# Patient Record
Sex: Male | Born: 1970 | Hispanic: No
Health system: Southern US, Community
[De-identification: ages and names within clinical notes are randomized; demographics above are authoritative.]

## PROBLEM LIST (undated history)

## (undated) DIAGNOSIS — R6520 Severe sepsis without septic shock: Secondary | ICD-10-CM

## (undated) DIAGNOSIS — J9621 Acute and chronic respiratory failure with hypoxia: Secondary | ICD-10-CM

## (undated) DIAGNOSIS — G4733 Obstructive sleep apnea (adult) (pediatric): Secondary | ICD-10-CM

## (undated) DIAGNOSIS — N170 Acute kidney failure with tubular necrosis: Secondary | ICD-10-CM

## (undated) DIAGNOSIS — F1011 Alcohol abuse, in remission: Secondary | ICD-10-CM

## (undated) DIAGNOSIS — A419 Sepsis, unspecified organism: Secondary | ICD-10-CM

---

## 2020-07-14 ENCOUNTER — Institutional Professional Consult (permissible substitution)
Admission: RE | Admit: 2020-07-14 | Discharge: 2020-08-20 | Disposition: A | Discharge status: Other Acute Inpatient Hospital | Payer: No Typology Code available for payment source | Source: Ambulatory Visit | Attending: Internal Medicine | Admitting: Internal Medicine

## 2020-07-14 ENCOUNTER — Other Ambulatory Visit (HOSPITAL_COMMUNITY): Payer: No Typology Code available for payment source

## 2020-07-14 DIAGNOSIS — Z930 Tracheostomy status: Secondary | ICD-10-CM

## 2020-07-14 DIAGNOSIS — J811 Chronic pulmonary edema: Secondary | ICD-10-CM

## 2020-07-14 DIAGNOSIS — Z9911 Dependence on respirator [ventilator] status: Secondary | ICD-10-CM

## 2020-07-14 DIAGNOSIS — R509 Fever, unspecified: Secondary | ICD-10-CM

## 2020-07-14 DIAGNOSIS — R238 Other skin changes: Secondary | ICD-10-CM

## 2020-07-14 DIAGNOSIS — G4733 Obstructive sleep apnea (adult) (pediatric): Secondary | ICD-10-CM | POA: Diagnosis present

## 2020-07-14 DIAGNOSIS — A419 Sepsis, unspecified organism: Secondary | ICD-10-CM | POA: Diagnosis present

## 2020-07-14 DIAGNOSIS — R609 Edema, unspecified: Secondary | ICD-10-CM

## 2020-07-14 DIAGNOSIS — R6520 Severe sepsis without septic shock: Secondary | ICD-10-CM | POA: Diagnosis present

## 2020-07-14 DIAGNOSIS — J690 Pneumonitis due to inhalation of food and vomit: Secondary | ICD-10-CM

## 2020-07-14 DIAGNOSIS — J189 Pneumonia, unspecified organism: Secondary | ICD-10-CM

## 2020-07-14 DIAGNOSIS — Z931 Gastrostomy status: Secondary | ICD-10-CM

## 2020-07-14 DIAGNOSIS — N170 Acute kidney failure with tubular necrosis: Secondary | ICD-10-CM | POA: Diagnosis present

## 2020-07-14 DIAGNOSIS — F1011 Alcohol abuse, in remission: Secondary | ICD-10-CM | POA: Diagnosis present

## 2020-07-14 DIAGNOSIS — Z452 Encounter for adjustment and management of vascular access device: Secondary | ICD-10-CM

## 2020-07-14 DIAGNOSIS — J90 Pleural effusion, not elsewhere classified: Secondary | ICD-10-CM

## 2020-07-14 DIAGNOSIS — J9621 Acute and chronic respiratory failure with hypoxia: Secondary | ICD-10-CM | POA: Diagnosis present

## 2020-07-14 DIAGNOSIS — J939 Pneumothorax, unspecified: Secondary | ICD-10-CM

## 2020-07-14 DIAGNOSIS — J9690 Respiratory failure, unspecified, unspecified whether with hypoxia or hypercapnia: Secondary | ICD-10-CM

## 2020-07-14 HISTORY — DX: Sepsis, unspecified organism: A41.9

## 2020-07-14 HISTORY — DX: Alcohol abuse, in remission: F10.11

## 2020-07-14 HISTORY — DX: Obstructive sleep apnea (adult) (pediatric): G47.33

## 2020-07-14 HISTORY — DX: Acute and chronic respiratory failure with hypoxia: J96.21

## 2020-07-14 HISTORY — DX: Severe sepsis without septic shock: R65.20

## 2020-07-14 HISTORY — DX: Acute kidney failure with tubular necrosis: N17.0

## 2020-07-14 LAB — BLOOD GAS, ARTERIAL
Acid-base deficit: 0.3 mmol/L (ref 0.0–2.0)
Bicarbonate: 24.2 mmol/L (ref 20.0–28.0)
FIO2: 40
O2 Saturation: 94.4 %
Patient temperature: 36.7
pCO2 arterial: 41.6 mmHg (ref 32.0–48.0)
pH, Arterial: 7.381 (ref 7.350–7.450)
pO2, Arterial: 74.6 mmHg — ABNORMAL LOW (ref 83.0–108.0)

## 2020-07-14 MED ORDER — IOHEXOL 350 MG/ML SOLN
50.0000 mL | Freq: Once | INTRAVENOUS | Status: AC | PRN
  Administered 2020-07-14: 50 mL

## 2020-07-15 ENCOUNTER — Other Ambulatory Visit (HOSPITAL_COMMUNITY): Payer: No Typology Code available for payment source

## 2020-07-15 ENCOUNTER — Encounter: Payer: Self-pay | Admitting: Internal Medicine

## 2020-07-15 DIAGNOSIS — J9621 Acute and chronic respiratory failure with hypoxia: Secondary | ICD-10-CM | POA: Diagnosis not present

## 2020-07-15 DIAGNOSIS — F1011 Alcohol abuse, in remission: Secondary | ICD-10-CM

## 2020-07-15 DIAGNOSIS — Z930 Tracheostomy status: Secondary | ICD-10-CM

## 2020-07-15 DIAGNOSIS — N170 Acute kidney failure with tubular necrosis: Secondary | ICD-10-CM | POA: Diagnosis present

## 2020-07-15 DIAGNOSIS — R6520 Severe sepsis without septic shock: Secondary | ICD-10-CM

## 2020-07-15 DIAGNOSIS — A419 Sepsis, unspecified organism: Secondary | ICD-10-CM

## 2020-07-15 DIAGNOSIS — G4733 Obstructive sleep apnea (adult) (pediatric): Secondary | ICD-10-CM | POA: Diagnosis not present

## 2020-07-15 DIAGNOSIS — Z9911 Dependence on respirator [ventilator] status: Secondary | ICD-10-CM

## 2020-07-15 LAB — BASIC METABOLIC PANEL
Anion gap: 10 (ref 5–15)
BUN: 19 mg/dL (ref 6–20)
CO2: 23 mmol/L (ref 22–32)
Calcium: 10 mg/dL (ref 8.9–10.3)
Chloride: 101 mmol/L (ref 98–111)
Creatinine, Ser: 0.64 mg/dL (ref 0.61–1.24)
GFR, Estimated: >60 mL/min (ref >60)
Glucose, Bld: 209 mg/dL — ABNORMAL HIGH (ref 70–99)
Potassium: 3.6 mmol/L (ref 3.5–5.1)
Sodium: 134 mmol/L — ABNORMAL LOW (ref 135–145)

## 2020-07-15 LAB — URINALYSIS, ROUTINE W REFLEX MICROSCOPIC
Bacteria, UA: NONE SEEN
Bilirubin Urine: NEGATIVE
Glucose, UA: NEGATIVE mg/dL
Hgb urine dipstick: NEGATIVE
Ketones, ur: NEGATIVE mg/dL
Leukocytes,Ua: NEGATIVE
Nitrite: NEGATIVE
Protein, ur: 30 mg/dL — AB
Specific Gravity, Urine: 1.013 (ref 1.005–1.030)
pH: 5 (ref 5.0–8.0)

## 2020-07-15 LAB — CBC
HCT: 32.3 % — ABNORMAL LOW (ref 39.0–52.0)
Hemoglobin: 10 g/dL — ABNORMAL LOW (ref 13.0–17.0)
MCH: 29.1 pg (ref 26.0–34.0)
MCHC: 31 g/dL (ref 30.0–36.0)
MCV: 93.9 fL (ref 80.0–100.0)
Platelets: 396 10*3/uL (ref 150–400)
RBC: 3.44 MIL/uL — ABNORMAL LOW (ref 4.22–5.81)
RDW: 17.4 % — ABNORMAL HIGH (ref 11.5–15.5)
WBC: 12.4 10*3/uL — ABNORMAL HIGH (ref 4.0–10.5)
nRBC: 0 % (ref 0.0–0.2)

## 2020-07-15 LAB — TRIGLYCERIDES: Triglycerides: 701 mg/dL — ABNORMAL HIGH (ref <150)

## 2020-07-15 NOTE — Consult Note (Signed)
Pulmonary Critical Care Medicine [facility] [place]  PULMONARY SERVICE  Date of Service: 07/15/2020  PULMONARY CRITICAL CARE CONSULT   Alex Rojas  [id number]  DOB: [birth year]   DOA: 07/14/2020  Referring Physician: Carron Curie, MD  HPI: Alex Rojas is a 50 y.o. male seen for follow up of Acute on Chronic Respiratory Failure.  Patient has multiple medical problems including respiratory failure severe sepsis sleep apnea DKA metabolic encephalopathy came into the hospital with sepsis from right groin infection.  The patient was evaluated at the time was significantly toxic appearance ended up having to be taken to the OR had an I&D done postoperatively developed respiratory failure rapidly was intubated and then remained on the ventilator afterwards.  Patient had initially been placed on Levophed as well as propofol for sedation.  Apparently self extubated on 30 March failed therefore had to be reintubated.  Patient was extubated on April 2 but by 4 April had to be reintubated.  Subsequently patient had a tracheostomy and a PEG tube placed.  Now is transferred to our facility for further management  Review of Systems:  ROS performed and is unremarkable other than noted above.  PAST MEDICAL HISTORY Past Medical History:  Diagnosis Date  . Anxiety  . Diabetes mellitus (HCC)  . Knee pain  left  . Low back pain    PAST SURGICAL HISTORY Past Surgical History:  Procedure Laterality Date  . HX CHOLECYSTECTOMY  . HX EYE SURGERY  as an infant   ALLERGIES No Known Allergies  FAMILY HISTORY Family History  Problem Relation Name Age of Onset  . Unknown Father  . Unknown Mother   SOCIAL HISTORY Social History   Tobacco Use  . Smoking status: Current Every Day Smoker  Packs/day: 0.50  Years: 20.00  Pack years: 10.00  . Smokeless tobacco: Never Used  . Tobacco comment: Pt encouraged to contact PCP when ready to quit  Substance Use Topics  .  Alcohol use: Yes  Comment: one glass of wine every other week  . Drug use: Not Currently  Types: Marijuana  Comment: last use 2018    Medications: Reviewed on Rounds  Physical Exam:  Vitals: Temperature is 99.3 pulse 118 respiratory rate 26 blood pressure is 144/79 saturations 94%  Ventilator Settings on assist control FiO2 is 40% tidal volume 500 PEEP 8  . General: Comfortable at this time . Eyes: Grossly normal lids, irises & conjunctiva . ENT: grossly tongue is normal . Neck: no obvious mass . Cardiovascular: S1-S2 normal no gallop or rub . Respiratory: No rhonchi no rales are noted at this time . Abdomen: Soft nontender . Skin: no rash seen on limited exam . Musculoskeletal: not rigid . Psychiatric:unable to assess . Neurologic: no seizure no involuntary movements         Labs on Admission:  Basic Metabolic Panel: Recent Labs  Lab 07/15/20 0526  NA 134*  K 3.6  CL 101  CO2 23  GLUCOSE 209*  BUN 19  CREATININE 0.64  CALCIUM 10.0    Recent Labs  Lab 07/14/20 2230  PHART 7.381  PCO2ART 41.6  PO2ART 74.6*  HCO3 24.2  O2SAT 94.4    Liver Function Tests: No results for input(s): AST, ALT, ALKPHOS, BILITOT, PROT, ALBUMIN in the last 168 hours. No results for input(s): LIPASE, AMYLASE in the last 168 hours. No results for input(s): AMMONIA in the last 168 hours.  CBC: Recent Labs  Lab 07/15/20 0526  WBC 12.4*  HGB  10.0*  HCT 32.3*  MCV 93.9  PLT 396    Cardiac Enzymes: No results for input(s): CKTOTAL, CKMB, CKMBINDEX, TROPONINI in the last 168 hours.  BNP (last 3 results) No results for input(s): BNP in the last 8760 hours.  ProBNP (last 3 results) No results for input(s): PROBNP in the last 8760 hours.   Radiological Exams on Admission: DG ABDOMEN PEG TUBE LOCATION  Result Date: 07/14/2020 CLINICAL DATA:  Status post percutaneous gastrostomy tube placement. EXAM: ABDOMEN - 1 VIEW COMPARISON:  None. FINDINGS: Portable AP supine view of  the abdomen obtained after the installation of 50 cc Omnipaque 350 through indwelling gastrostomy tube. Contrast opacifies the gastrostomy tubing and the stomach. There is no evidence of extravasation or leak. Normal bowel gas pattern. Right upper quadrant surgical clips. IMPRESSION: Gastrostomy tube in the stomach. No evidence of extravasation or leak. Electronically Signed   By: Narda Rutherford M.D.   On: 07/14/2020 23:20   DG Chest Port 1 View  Result Date: 07/15/2020 CLINICAL DATA:  Respiratory failure EXAM: PORTABLE CHEST 1 VIEW COMPARISON:  None. FINDINGS: Tracheostomy catheter tip is 6.7 cm above the carina. Central catheter tip is at the cavoatrial junction. No pneumothorax. There is no edema or airspace opacity. There is mild cardiomegaly with pulmonary vascularity normal. No adenopathy. No bone lesions. IMPRESSION: Tube and catheter positions as described without pneumothorax. Lungs clear. Mild cardiac enlargement. Electronically Signed   By: Bretta Bang III M.D.   On: 07/15/2020 08:46    Assessment/Plan Active Problems:   Acute on chronic respiratory failure with hypoxia (HCC)   Severe sepsis (HCC)   Obstructive sleep apnea   History of alcohol abuse   Acute renal failure due to tubular necrosis (HCC)   1. Acute on chronic respiratory failure hypoxia patient remains on mechanical ventilation at this time.  Respiratory therapy is going to check the mechanics however patient is still on propofol which will need to be weaned off and medications readjusted. 2. Severe sepsis patient has received antibiotics plan is going to be to continue with full support at this time. 3. Obstructive sleep apnea nonissue at this stage 4. History of alcohol abuse this may be the challenge as far as being able to wean the patient off of the propofol.  We need to reassess the medications for sedation. 5. Acute renal failure her plan will be to continue to monitor the patient's labs closely.  Right now  BUN/creatinine appear to be at baseline  I have personally seen and evaluated the patient, evaluated laboratory and imaging results, formulated the assessment and plan and placed orders. The Patient requires high complexity decision making with multiple systems involvement.  Case was discussed on Rounds with the Respiratory Therapy Director and the Respiratory staff Time Spent  Yevonne Pax, MD [facility] Pulmonary Critical Care Medicine Sleep Medicine

## 2020-07-16 DIAGNOSIS — N170 Acute kidney failure with tubular necrosis: Secondary | ICD-10-CM | POA: Diagnosis not present

## 2020-07-16 DIAGNOSIS — F1011 Alcohol abuse, in remission: Secondary | ICD-10-CM | POA: Diagnosis not present

## 2020-07-16 DIAGNOSIS — G4733 Obstructive sleep apnea (adult) (pediatric): Secondary | ICD-10-CM | POA: Diagnosis not present

## 2020-07-16 DIAGNOSIS — J9621 Acute and chronic respiratory failure with hypoxia: Secondary | ICD-10-CM | POA: Diagnosis not present

## 2020-07-16 LAB — HEMOGLOBIN A1C
Hgb A1c MFr Bld: 11 % — ABNORMAL HIGH (ref 4.8–5.6)
Mean Plasma Glucose: 269 mg/dL

## 2020-07-16 LAB — CBC
HCT: 32.7 % — ABNORMAL LOW (ref 39.0–52.0)
Hemoglobin: 10.3 g/dL — ABNORMAL LOW (ref 13.0–17.0)
MCH: 29.9 pg (ref 26.0–34.0)
MCHC: 31.5 g/dL (ref 30.0–36.0)
MCV: 95.1 fL (ref 80.0–100.0)
Platelets: 409 10*3/uL — ABNORMAL HIGH (ref 150–400)
RBC: 3.44 MIL/uL — ABNORMAL LOW (ref 4.22–5.81)
RDW: 17 % — ABNORMAL HIGH (ref 11.5–15.5)
WBC: 10.6 10*3/uL — ABNORMAL HIGH (ref 4.0–10.5)
nRBC: 0 % (ref 0.0–0.2)

## 2020-07-16 LAB — COMPREHENSIVE METABOLIC PANEL
ALT: 32 U/L (ref 0–44)
AST: 46 U/L — ABNORMAL HIGH (ref 15–41)
Albumin: 2.4 g/dL — ABNORMAL LOW (ref 3.5–5.0)
Alkaline Phosphatase: 117 U/L (ref 38–126)
Anion gap: 11 (ref 5–15)
BUN: 26 mg/dL — ABNORMAL HIGH (ref 6–20)
CO2: 27 mmol/L (ref 22–32)
Calcium: 10.5 mg/dL — ABNORMAL HIGH (ref 8.9–10.3)
Chloride: 99 mmol/L (ref 98–111)
Creatinine, Ser: 0.57 mg/dL — ABNORMAL LOW (ref 0.61–1.24)
GFR, Estimated: >60 mL/min (ref >60)
Glucose, Bld: 279 mg/dL — ABNORMAL HIGH (ref 70–99)
Potassium: 3.6 mmol/L (ref 3.5–5.1)
Sodium: 137 mmol/L (ref 135–145)
Total Bilirubin: 0.6 mg/dL (ref 0.3–1.2)
Total Protein: 7.6 g/dL (ref 6.5–8.1)

## 2020-07-16 LAB — URINE CULTURE: Culture: NO GROWTH

## 2020-07-16 LAB — PHOSPHORUS: Phosphorus: 5.3 mg/dL — ABNORMAL HIGH (ref 2.5–4.6)

## 2020-07-16 LAB — MAGNESIUM: Magnesium: 1.9 mg/dL (ref 1.7–2.4)

## 2020-07-16 LAB — TSH: TSH: 5.149 u[IU]/mL — ABNORMAL HIGH (ref 0.350–4.500)

## 2020-07-16 NOTE — Progress Notes (Signed)
Pulmonary Critical Care Medicine [facility] [place]   PULMONARY CRITICAL CARE SERVICE  PROGRESS NOTE     Alex Rojas  [id number]  DOB: Jun 10, 1970   DOA: 07/14/2020  Referring Physician: Carron Curie, MD  HPI: Alex Rojas is a 50 y.o. male seen for follow up of Acute on Chronic Respiratory Failure.  Patient is comfortably without distress has been on pressure support  Medications: Reviewed on Rounds  Physical Exam:  Vitals: Temperature is 98.0 pulse 90 respiratory rate is 17 blood pressure is 128/76 saturations 95%  Ventilator Settings on pressure support FiO2 40% pressure 12/6  . General: Comfortable at this time . Eyes: Grossly normal lids, irises & conjunctiva . ENT: grossly tongue is normal . Neck: no obvious mass . Cardiovascular: S1 S2 normal no gallop . Respiratory: No rhonchi no rales are noted at this time . Abdomen: soft . Skin: no rash seen on limited exam . Musculoskeletal: not rigid . Psychiatric:unable to assess . Neurologic: no seizure no involuntary movements         Lab Data:   Basic Metabolic Panel: Recent Labs  Lab 07/15/20 0526 07/16/20 0609  NA 134* 137  K 3.6 3.6  CL 101 99  CO2 23 27  GLUCOSE 209* 279*  BUN 19 26*  CREATININE 0.64 0.57*  CALCIUM 10.0 10.5*  MG  --  1.9  PHOS  --  5.3*    ABG: Recent Labs  Lab 07/14/20 2230  PHART 7.381  PCO2ART 41.6  PO2ART 74.6*  HCO3 24.2  O2SAT 94.4    Liver Function Tests: Recent Labs  Lab 07/16/20 0609  AST 46*  ALT 32  ALKPHOS 117  BILITOT 0.6  PROT 7.6  ALBUMIN 2.4*   No results for input(s): LIPASE, AMYLASE in the last 168 hours. No results for input(s): AMMONIA in the last 168 hours.  CBC: Recent Labs  Lab 07/15/20 0526 07/16/20 0609  WBC 12.4* 10.6*  HGB 10.0* 10.3*  HCT 32.3* 32.7*  MCV 93.9 95.1  PLT 396 409*    Cardiac Enzymes: No results for input(s): CKTOTAL, CKMB, CKMBINDEX, TROPONINI in the last 168 hours.  BNP  (last 3 results) No results for input(s): BNP in the last 8760 hours.  ProBNP (last 3 results) No results for input(s): PROBNP in the last 8760 hours.  Radiological Exams: DG ABDOMEN PEG TUBE LOCATION  Result Date: 07/14/2020 CLINICAL DATA:  Status post percutaneous gastrostomy tube placement. EXAM: ABDOMEN - 1 VIEW COMPARISON:  None. FINDINGS: Portable AP supine view of the abdomen obtained after the installation of 50 cc Omnipaque 350 through indwelling gastrostomy tube. Contrast opacifies the gastrostomy tubing and the stomach. There is no evidence of extravasation or leak. Normal bowel gas pattern. Right upper quadrant surgical clips. IMPRESSION: Gastrostomy tube in the stomach. No evidence of extravasation or leak. Electronically Signed   By: Narda Rutherford M.D.   On: 07/14/2020 23:20   DG Chest Port 1 View  Result Date: 07/15/2020 CLINICAL DATA:  Respiratory failure EXAM: PORTABLE CHEST 1 VIEW COMPARISON:  None. FINDINGS: Tracheostomy catheter tip is 6.7 cm above the carina. Central catheter tip is at the cavoatrial junction. No pneumothorax. There is no edema or airspace opacity. There is mild cardiomegaly with pulmonary vascularity normal. No adenopathy. No bone lesions. IMPRESSION: Tube and catheter positions as described without pneumothorax. Lungs clear. Mild cardiac enlargement. Electronically Signed   By: Bretta Bang III M.D.   On: 07/15/2020 08:46    Assessment/Plan Active Problems:  Acute on chronic respiratory failure with hypoxia (HCC)   Severe sepsis (HCC)   Obstructive sleep apnea   History of alcohol abuse   Acute renal failure due to tubular necrosis (HCC)   1. Acute on chronic respiratory failure hypoxia plan is going to be to continue with pulmonary we will on pressure support goal of 4 hours. 2. Severe sepsis treated slowly improving 3. Obstructive sleep apnea supportive care patient is on the ventilator 4. History of alcohol abuse no change 5. Acute  renal failure patient is at baseline   I have personally seen and evaluated the patient, evaluated laboratory and imaging results, formulated the assessment and plan and placed orders. The Patient requires high complexity decision making with multiple systems involvement.  Rounds were done with the Respiratory Therapy Director and Staff therapists and discussed with nursing staff also.  Yevonne Pax, MD [facility] Pulmonary Critical Care Medicine Sleep Medicine

## 2020-07-17 DIAGNOSIS — G4733 Obstructive sleep apnea (adult) (pediatric): Secondary | ICD-10-CM | POA: Diagnosis not present

## 2020-07-17 DIAGNOSIS — N170 Acute kidney failure with tubular necrosis: Secondary | ICD-10-CM | POA: Diagnosis not present

## 2020-07-17 DIAGNOSIS — F1011 Alcohol abuse, in remission: Secondary | ICD-10-CM | POA: Diagnosis not present

## 2020-07-17 DIAGNOSIS — J9621 Acute and chronic respiratory failure with hypoxia: Secondary | ICD-10-CM | POA: Diagnosis not present

## 2020-07-17 LAB — MAGNESIUM: Magnesium: 1.9 mg/dL (ref 1.7–2.4)

## 2020-07-17 LAB — BASIC METABOLIC PANEL
Anion gap: 11 (ref 5–15)
BUN: 30 mg/dL — ABNORMAL HIGH (ref 6–20)
CO2: 28 mmol/L (ref 22–32)
Calcium: 10.5 mg/dL — ABNORMAL HIGH (ref 8.9–10.3)
Chloride: 97 mmol/L — ABNORMAL LOW (ref 98–111)
Creatinine, Ser: 0.63 mg/dL (ref 0.61–1.24)
GFR, Estimated: >60 mL/min (ref >60)
Glucose, Bld: 289 mg/dL — ABNORMAL HIGH (ref 70–99)
Potassium: 3.6 mmol/L (ref 3.5–5.1)
Sodium: 136 mmol/L (ref 135–145)

## 2020-07-17 NOTE — Progress Notes (Signed)
Pulmonary Critical Care Medicine [facility] [place]   PULMONARY CRITICAL CARE SERVICE  PROGRESS NOTE     Alex Rojas  [id number]  DOB: 1970-12-27   DOA: 07/14/2020  Referring Physician: Carron Curie, MD  HPI: Alex Rojas is a 50 y.o. male seen for follow up of Acute on Chronic Respiratory Failure.  Patient right now is on pressure support has been on 35% FiO2 with pressure of 10/5  Medications: Reviewed on Rounds  Physical Exam:  Vitals: Temperature 100.0 pulse 106 respiratory rate is 17 blood pressure is 127/74 saturations 93%  Ventilator Settings on pressure support FiO2 35% pressure 10/5  . General: Comfortable at this time . Eyes: Grossly normal lids, irises & conjunctiva . ENT: grossly tongue is normal . Neck: no obvious mass . Cardiovascular: S1 S2 normal no gallop . Respiratory: No rhonchi no rales noted at this time . Abdomen: soft . Skin: no rash seen on limited exam . Musculoskeletal: not rigid . Psychiatric:unable to assess . Neurologic: no seizure no involuntary movements         Lab Data:   Basic Metabolic Panel: Recent Labs  Lab 07/15/20 0526 07/16/20 0609 07/17/20 0426  NA 134* 137 136  K 3.6 3.6 3.6  CL 101 99 97*  CO2 23 27 28   GLUCOSE 209* 279* 289*  BUN 19 26* 30*  CREATININE 0.64 0.57* 0.63  CALCIUM 10.0 10.5* 10.5*  MG  --  1.9 1.9  PHOS  --  5.3*  --     ABG: Recent Labs  Lab 07/14/20 2230  PHART 7.381  PCO2ART 41.6  PO2ART 74.6*  HCO3 24.2  O2SAT 94.4    Liver Function Tests: Recent Labs  Lab 07/16/20 0609  AST 46*  ALT 32  ALKPHOS 117  BILITOT 0.6  PROT 7.6  ALBUMIN 2.4*   No results for input(s): LIPASE, AMYLASE in the last 168 hours. No results for input(s): AMMONIA in the last 168 hours.  CBC: Recent Labs  Lab 07/15/20 0526 07/16/20 0609  WBC 12.4* 10.6*  HGB 10.0* 10.3*  HCT 32.3* 32.7*  MCV 93.9 95.1  PLT 396 409*    Cardiac Enzymes: No results for input(s):  CKTOTAL, CKMB, CKMBINDEX, TROPONINI in the last 168 hours.  BNP (last 3 results) No results for input(s): BNP in the last 8760 hours.  ProBNP (last 3 results) No results for input(s): PROBNP in the last 8760 hours.  Radiological Exams: No results found.  Assessment/Plan Active Problems:   Acute on chronic respiratory failure with hypoxia (HCC)   Severe sepsis (HCC)   Obstructive sleep apnea   History of alcohol abuse   Acute renal failure due to tubular necrosis (HCC)   1. Acute on chronic respiratory failure with hypoxia plan is to continue with weaning on pressure with a goal of 8 hours 2. Severe sepsis treated and resolution 3. Obstructive sleep apnea no change 4. Alcohol abuse by history continue supportive care 5. Acute renal failure no change continue to follow   I have personally seen and evaluated the patient, evaluated laboratory and imaging results, formulated the assessment and plan and placed orders. The Patient requires high complexity decision making with multiple systems involvement.  Rounds were done with the Respiratory Therapy Director and Staff therapists and discussed with nursing staff also.  07/18/20, MD [facility] Pulmonary Critical Care Medicine Sleep Medicine

## 2020-07-18 DIAGNOSIS — F1011 Alcohol abuse, in remission: Secondary | ICD-10-CM | POA: Diagnosis not present

## 2020-07-18 DIAGNOSIS — G4733 Obstructive sleep apnea (adult) (pediatric): Secondary | ICD-10-CM | POA: Diagnosis not present

## 2020-07-18 DIAGNOSIS — N170 Acute kidney failure with tubular necrosis: Secondary | ICD-10-CM | POA: Diagnosis not present

## 2020-07-18 DIAGNOSIS — J9621 Acute and chronic respiratory failure with hypoxia: Secondary | ICD-10-CM | POA: Diagnosis not present

## 2020-07-18 LAB — CULTURE, RESPIRATORY W GRAM STAIN

## 2020-07-18 LAB — BASIC METABOLIC PANEL WITH GFR
Anion gap: 11 (ref 5–15)
BUN: 44 mg/dL — ABNORMAL HIGH (ref 6–20)
CO2: 28 mmol/L (ref 22–32)
Calcium: 11.3 mg/dL — ABNORMAL HIGH (ref 8.9–10.3)
Chloride: 98 mmol/L (ref 98–111)
Creatinine, Ser: 0.73 mg/dL (ref 0.61–1.24)
GFR, Estimated: >60 mL/min
Glucose, Bld: 262 mg/dL — ABNORMAL HIGH (ref 70–99)
Potassium: 3.4 mmol/L — ABNORMAL LOW (ref 3.5–5.1)
Sodium: 137 mmol/L (ref 135–145)

## 2020-07-18 LAB — TRIGLYCERIDES: Triglycerides: 670 mg/dL — ABNORMAL HIGH

## 2020-07-18 LAB — CBC
HCT: 32.1 % — ABNORMAL LOW (ref 39.0–52.0)
Hemoglobin: 10.2 g/dL — ABNORMAL LOW (ref 13.0–17.0)
MCH: 29.2 pg (ref 26.0–34.0)
MCHC: 31.8 g/dL (ref 30.0–36.0)
MCV: 92 fL (ref 80.0–100.0)
Platelets: 448 10*3/uL — ABNORMAL HIGH (ref 150–400)
RBC: 3.49 MIL/uL — ABNORMAL LOW (ref 4.22–5.81)
RDW: 16.5 % — ABNORMAL HIGH (ref 11.5–15.5)
WBC: 11.1 10*3/uL — ABNORMAL HIGH (ref 4.0–10.5)
nRBC: 0.3 % — ABNORMAL HIGH (ref 0.0–0.2)

## 2020-07-18 LAB — MAGNESIUM: Magnesium: 2 mg/dL (ref 1.7–2.4)

## 2020-07-18 LAB — PHOSPHORUS: Phosphorus: 4.2 mg/dL (ref 2.5–4.6)

## 2020-07-18 NOTE — Progress Notes (Signed)
Pulmonary Critical Care Medicine [facility] [place]   PULMONARY CRITICAL CARE SERVICE  PROGRESS NOTE     Alex Rojas  [id number]  DOB: [birth year]   DOA: 07/14/2020  Referring Physician: Carron Curie, MD  HPI: Alex Rojas is a 50 y.o. male seen for follow up of Acute on Chronic Respiratory Failure.  Patient currently is on the ventilator on assist control mode seems to be tolerating his current settings fairly well  Medications: Reviewed on Rounds  Physical Exam:  Vitals: Temperature is 98.8 pulse 103 respiratory rate 30 blood pressure is 116/64 saturations 94%  Ventilator Settings on assist control tidal volume 500 PEEP 5  . General: Comfortable at this time . Eyes: Grossly normal lids, irises & conjunctiva . ENT: grossly tongue is normal . Neck: no obvious mass . Cardiovascular: S1 S2 normal no gallop . Respiratory: Scattered rhonchi expansion is equal . Abdomen: soft . Skin: no rash seen on limited exam . Musculoskeletal: not rigid . Psychiatric:unable to assess . Neurologic: no seizure no involuntary movements         Lab Data:   Basic Metabolic Panel: Recent Labs  Lab 07/15/20 0526 07/16/20 0609 07/17/20 0426 07/18/20 0430  NA 134* 137 136 137  K 3.6 3.6 3.6 3.4*  CL 101 99 97* 98  CO2 23 27 28 28   GLUCOSE 209* 279* 289* 262*  BUN 19 26* 30* 44*  CREATININE 0.64 0.57* 0.63 0.73  CALCIUM 10.0 10.5* 10.5* 11.3*  MG  --  1.9 1.9 2.0  PHOS  --  5.3*  --  4.2    ABG: Recent Labs  Lab 07/14/20 2230  PHART 7.381  PCO2ART 41.6  PO2ART 74.6*  HCO3 24.2  O2SAT 94.4    Liver Function Tests: Recent Labs  Lab 07/16/20 0609  AST 46*  ALT 32  ALKPHOS 117  BILITOT 0.6  PROT 7.6  ALBUMIN 2.4*   No results for input(s): LIPASE, AMYLASE in the last 168 hours. No results for input(s): AMMONIA in the last 168 hours.  CBC: Recent Labs  Lab 07/15/20 0526 07/16/20 0609 07/18/20 0430  WBC 12.4* 10.6* 11.1*  HGB  10.0* 10.3* 10.2*  HCT 32.3* 32.7* 32.1*  MCV 93.9 95.1 92.0  PLT 396 409* 448*    Cardiac Enzymes: No results for input(s): CKTOTAL, CKMB, CKMBINDEX, TROPONINI in the last 168 hours.  BNP (last 3 results) No results for input(s): BNP in the last 8760 hours.  ProBNP (last 3 results) No results for input(s): PROBNP in the last 8760 hours.  Radiological Exams: No results found.  Assessment/Plan Active Problems:   Acute on chronic respiratory failure with hypoxia (HCC)   Severe sepsis (HCC)   Obstructive sleep apnea   History of alcohol abuse   Acute renal failure due to tubular necrosis (HCC)   1. Acute on chronic respiratory failure hypoxia plan is going to be to continue with weaning on pressure support as the patient did yesterday. 2. Severe sepsis treated resolved 3. Obstructive sleep apnea nonissue 4. History of alcohol abuse and no signs of active withdrawal medications need to be adjusted 5. Acute renal failure supportive care monitor lab   I have personally seen and evaluated the patient, evaluated laboratory and imaging results, formulated the assessment and plan and placed orders. The Patient requires high complexity decision making with multiple systems involvement.  Rounds were done with the Respiratory Therapy Director and Staff therapists and discussed with nursing staff also.  07/20/20, MD  [facility] Pulmonary Critical Care Medicine Sleep Medicine

## 2020-07-19 LAB — POTASSIUM: Potassium: 4 mmol/L (ref 3.5–5.1)

## 2020-07-20 ENCOUNTER — Other Ambulatory Visit (HOSPITAL_COMMUNITY): Payer: No Typology Code available for payment source

## 2020-07-20 LAB — BASIC METABOLIC PANEL
Anion gap: 9 (ref 5–15)
BUN: 47 mg/dL — ABNORMAL HIGH (ref 6–20)
CO2: 29 mmol/L (ref 22–32)
Calcium: 10.5 mg/dL — ABNORMAL HIGH (ref 8.9–10.3)
Chloride: 98 mmol/L (ref 98–111)
Creatinine, Ser: 0.91 mg/dL (ref 0.61–1.24)
GFR, Estimated: >60 mL/min (ref >60)
Glucose, Bld: 276 mg/dL — ABNORMAL HIGH (ref 70–99)
Potassium: 4.4 mmol/L (ref 3.5–5.1)
Sodium: 136 mmol/L (ref 135–145)

## 2020-07-20 LAB — CBC
HCT: 32.9 % — ABNORMAL LOW (ref 39.0–52.0)
Hemoglobin: 10.3 g/dL — ABNORMAL LOW (ref 13.0–17.0)
MCH: 29 pg (ref 26.0–34.0)
MCHC: 31.3 g/dL (ref 30.0–36.0)
MCV: 92.7 fL (ref 80.0–100.0)
Platelets: 464 10*3/uL — ABNORMAL HIGH (ref 150–400)
RBC: 3.55 MIL/uL — ABNORMAL LOW (ref 4.22–5.81)
RDW: 16.7 % — ABNORMAL HIGH (ref 11.5–15.5)
WBC: 14.5 10*3/uL — ABNORMAL HIGH (ref 4.0–10.5)
nRBC: 0.1 % (ref 0.0–0.2)

## 2020-07-20 LAB — PHOSPHORUS: Phosphorus: 4.4 mg/dL (ref 2.5–4.6)

## 2020-07-20 LAB — MAGNESIUM: Magnesium: 2.2 mg/dL (ref 1.7–2.4)

## 2020-07-20 LAB — URINALYSIS, ROUTINE W REFLEX MICROSCOPIC
Bilirubin Urine: NEGATIVE
Glucose, UA: NEGATIVE mg/dL
Hgb urine dipstick: NEGATIVE
Ketones, ur: NEGATIVE mg/dL
Leukocytes,Ua: NEGATIVE
Nitrite: NEGATIVE
Protein, ur: NEGATIVE mg/dL
Specific Gravity, Urine: 1.01 (ref 1.005–1.030)
pH: 6 (ref 5.0–8.0)

## 2020-07-21 DIAGNOSIS — G4733 Obstructive sleep apnea (adult) (pediatric): Secondary | ICD-10-CM | POA: Diagnosis not present

## 2020-07-21 DIAGNOSIS — J9621 Acute and chronic respiratory failure with hypoxia: Secondary | ICD-10-CM | POA: Diagnosis not present

## 2020-07-21 DIAGNOSIS — F1011 Alcohol abuse, in remission: Secondary | ICD-10-CM | POA: Diagnosis not present

## 2020-07-21 DIAGNOSIS — N170 Acute kidney failure with tubular necrosis: Secondary | ICD-10-CM | POA: Diagnosis not present

## 2020-07-21 LAB — BASIC METABOLIC PANEL
Anion gap: 9 (ref 5–15)
BUN: 47 mg/dL — ABNORMAL HIGH (ref 6–20)
CO2: 30 mmol/L (ref 22–32)
Calcium: 10.1 mg/dL (ref 8.9–10.3)
Chloride: 96 mmol/L — ABNORMAL LOW (ref 98–111)
Creatinine, Ser: 0.85 mg/dL (ref 0.61–1.24)
GFR, Estimated: >60 mL/min (ref >60)
Glucose, Bld: 263 mg/dL — ABNORMAL HIGH (ref 70–99)
Potassium: 4.5 mmol/L (ref 3.5–5.1)
Sodium: 135 mmol/L (ref 135–145)

## 2020-07-21 LAB — CBC
HCT: 31.5 % — ABNORMAL LOW (ref 39.0–52.0)
Hemoglobin: 9.7 g/dL — ABNORMAL LOW (ref 13.0–17.0)
MCH: 28.8 pg (ref 26.0–34.0)
MCHC: 30.8 g/dL (ref 30.0–36.0)
MCV: 93.5 fL (ref 80.0–100.0)
Platelets: 456 10*3/uL — ABNORMAL HIGH (ref 150–400)
RBC: 3.37 MIL/uL — ABNORMAL LOW (ref 4.22–5.81)
RDW: 16.4 % — ABNORMAL HIGH (ref 11.5–15.5)
WBC: 14.5 10*3/uL — ABNORMAL HIGH (ref 4.0–10.5)
nRBC: 0.1 % (ref 0.0–0.2)

## 2020-07-21 LAB — TRIGLYCERIDES: Triglycerides: 613 mg/dL — ABNORMAL HIGH (ref <150)

## 2020-07-21 LAB — URINE CULTURE: Culture: NO GROWTH

## 2020-07-21 LAB — AMMONIA: Ammonia: 47 umol/L — ABNORMAL HIGH (ref 9–35)

## 2020-07-21 LAB — PHOSPHORUS: Phosphorus: 4.4 mg/dL (ref 2.5–4.6)

## 2020-07-21 LAB — MAGNESIUM: Magnesium: 2.2 mg/dL (ref 1.7–2.4)

## 2020-07-21 NOTE — Progress Notes (Signed)
Pulmonary Critical Care Medicine [facility] [place]   PULMONARY CRITICAL CARE SERVICE  PROGRESS NOTE     Alex Rojas  [id number]  DOB: Nov 11, 1970   DOA: 07/14/2020  Referring Physician: Carron Curie, MD  HPI: Alex Rojas is a 50 y.o. male seen for follow up of Acute on Chronic Respiratory Failure.  Patient is afebrile comfortable right now without distress at this time has been noted to have some fevers over the weekend but appears to have defervesced  Medications: Reviewed on Rounds  Physical Exam:  Vitals: Temperature is 98.9 pulse 101 respiratory 21 blood pressure is 104/60 saturations 94%  Ventilator Settings pressure support FiO2 50% pressure 12/5  . General: Comfortable at this time . Eyes: Grossly normal lids, irises & conjunctiva . ENT: grossly tongue is normal . Neck: no obvious mass . Cardiovascular: S1 S2 normal no gallop . Respiratory: No rhonchi no rales are noted at this time . Abdomen: soft . Skin: no rash seen on limited exam . Musculoskeletal: not rigid . Psychiatric:unable to assess . Neurologic: no seizure no involuntary movements         Lab Data:   Basic Metabolic Panel: Recent Labs  Lab 07/16/20 0609 07/17/20 0426 07/18/20 0430 07/19/20 0249 07/20/20 0419 07/21/20 0630  NA 137 136 137  --  136 135  K 3.6 3.6 3.4* 4.0 4.4 4.5  CL 99 97* 98  --  98 96*  CO2 27 28 28   --  29 30  GLUCOSE 279* 289* 262*  --  276* 263*  BUN 26* 30* 44*  --  47* 47*  CREATININE 0.57* 0.63 0.73  --  0.91 0.85  CALCIUM 10.5* 10.5* 11.3*  --  10.5* 10.1  MG 1.9 1.9 2.0  --  2.2 2.2  PHOS 5.3*  --  4.2  --  4.4 4.4    ABG: Recent Labs  Lab 07/14/20 2230  PHART 7.381  PCO2ART 41.6  PO2ART 74.6*  HCO3 24.2  O2SAT 94.4    Liver Function Tests: Recent Labs  Lab 07/16/20 0609  AST 46*  ALT 32  ALKPHOS 117  BILITOT 0.6  PROT 7.6  ALBUMIN 2.4*   No results for input(s): LIPASE, AMYLASE in the last 168  hours. Recent Labs  Lab 07/21/20 0630  AMMONIA 47*    CBC: Recent Labs  Lab 07/15/20 0526 07/16/20 0609 07/18/20 0430 07/20/20 0419 07/21/20 0630  WBC 12.4* 10.6* 11.1* 14.5* 14.5*  HGB 10.0* 10.3* 10.2* 10.3* 9.7*  HCT 32.3* 32.7* 32.1* 32.9* 31.5*  MCV 93.9 95.1 92.0 92.7 93.5  PLT 396 409* 448* 464* 456*    Cardiac Enzymes: No results for input(s): CKTOTAL, CKMB, CKMBINDEX, TROPONINI in the last 168 hours.  BNP (last 3 results) No results for input(s): BNP in the last 8760 hours.  ProBNP (last 3 results) No results for input(s): PROBNP in the last 8760 hours.  Radiological Exams: DG Chest Port 1 View  Result Date: 07/20/2020 CLINICAL DATA:  Respirator dependent.  Assess for pneumothorax. EXAM: PORTABLE CHEST 1 VIEW COMPARISON:  July 15, 2020 FINDINGS: The heart size and mediastinal contours are stable. Tracheostomy tube is unchanged. Right central venous line is unchanged. The heart size is enlarged unchanged. Prominence the left paratracheal region is identified possibly due to tortuous aorta but underlying mass is not excluded. This is unchanged. Patchy consolidation of medial left lung base is identified. The right lung is clear. The visualized skeletal structures are stable. IMPRESSION: 1. Prominence the left  paratracheal region is identified possibly due to tortuous aorta but underlying mass is not excluded. 2. Patchy consolidation of medial left lung base. Electronically Signed   By: Sherian Rein M.D.   On: 07/20/2020 12:01    Assessment/Plan Active Problems:   Acute on chronic respiratory failure with hypoxia (HCC)   Severe sepsis (HCC)   Obstructive sleep apnea   History of alcohol abuse   Acute renal failure due to tubular necrosis (HCC)   1. Acute on chronic respiratory failure with hypoxia plan is going to be to continue with the wean on pressure support for 16 hours since the temperatures have come down we will continue to reassess 2. Severe sepsis  treated in resolution 3. Obstructive sleep apnea no change 4. Alcohol abuse by history supportive care 5. Acute renal failure no change   I have personally seen and evaluated the patient, evaluated laboratory and imaging results, formulated the assessment and plan and placed orders. The Patient requires high complexity decision making with multiple systems involvement.  Rounds were done with the Respiratory Therapy Director and Staff therapists and discussed with nursing staff also.  Yevonne Pax, MD [facility] Pulmonary Critical Care Medicine Sleep Medicine

## 2020-07-22 DIAGNOSIS — J9621 Acute and chronic respiratory failure with hypoxia: Secondary | ICD-10-CM | POA: Diagnosis not present

## 2020-07-22 DIAGNOSIS — N170 Acute kidney failure with tubular necrosis: Secondary | ICD-10-CM | POA: Diagnosis not present

## 2020-07-22 DIAGNOSIS — G4733 Obstructive sleep apnea (adult) (pediatric): Secondary | ICD-10-CM | POA: Diagnosis not present

## 2020-07-22 DIAGNOSIS — F1011 Alcohol abuse, in remission: Secondary | ICD-10-CM | POA: Diagnosis not present

## 2020-07-22 LAB — CULTURE, RESPIRATORY W GRAM STAIN: Culture: NORMAL

## 2020-07-22 LAB — CBC
HCT: 32.1 % — ABNORMAL LOW (ref 39.0–52.0)
Hemoglobin: 9.7 g/dL — ABNORMAL LOW (ref 13.0–17.0)
MCH: 28.6 pg (ref 26.0–34.0)
MCHC: 30.2 g/dL (ref 30.0–36.0)
MCV: 94.7 fL (ref 80.0–100.0)
Platelets: 405 10*3/uL — ABNORMAL HIGH (ref 150–400)
RBC: 3.39 MIL/uL — ABNORMAL LOW (ref 4.22–5.81)
RDW: 16.8 % — ABNORMAL HIGH (ref 11.5–15.5)
WBC: 12.6 10*3/uL — ABNORMAL HIGH (ref 4.0–10.5)
nRBC: 0.2 % (ref 0.0–0.2)

## 2020-07-22 LAB — VANCOMYCIN, TROUGH: Vancomycin Tr: 32 ug/mL (ref 15–20)

## 2020-07-22 LAB — BASIC METABOLIC PANEL
Anion gap: 8 (ref 5–15)
BUN: 47 mg/dL — ABNORMAL HIGH (ref 6–20)
CO2: 30 mmol/L (ref 22–32)
Calcium: 9.7 mg/dL (ref 8.9–10.3)
Chloride: 99 mmol/L (ref 98–111)
Creatinine, Ser: 1.01 mg/dL (ref 0.61–1.24)
GFR, Estimated: >60 mL/min (ref >60)
Glucose, Bld: 248 mg/dL — ABNORMAL HIGH (ref 70–99)
Potassium: 4.8 mmol/L (ref 3.5–5.1)
Sodium: 137 mmol/L (ref 135–145)

## 2020-07-22 LAB — LACTIC ACID, PLASMA: Lactic Acid, Venous: 1.1 mmol/L (ref 0.5–1.9)

## 2020-07-22 NOTE — Progress Notes (Signed)
Pulmonary Critical Care Medicine [facility] [place]   PULMONARY CRITICAL CARE SERVICE  PROGRESS NOTE     Arlington Sigmund  YBO:[id number]  DOB: May 20, 1970   DOA: 07/14/2020  Referring Physician: Carron Curie, MD  HPI: Alex Rojas is a 50 y.o. male seen for follow up of Acute on Chronic Respiratory Failure.  Patient right now is on assist control mode supposed to T collar wean today for 2 hours  Medications: Reviewed on Rounds  Physical Exam:  Vitals: Temperature is 101 pulse 75 respiratory rate 16 blood pressure is 101/59 saturations 91%  Ventilator Settings on assist control FiO2 50% tidal volume 500 with a PEEP of 5  . General: Comfortable at this time . Eyes: Grossly normal lids, irises & conjunctiva . ENT: grossly tongue is normal . Neck: no obvious mass . Cardiovascular: S1 S2 normal no gallop . Respiratory: No rhonchi no rales noted at this time. . Abdomen: soft . Skin: no rash seen on limited exam . Musculoskeletal: not rigid . Psychiatric:unable to assess . Neurologic: no seizure no involuntary movements         Lab Data:   Basic Metabolic Panel: Recent Labs  Lab 07/16/20 0609 07/17/20 0426 07/18/20 0430 07/19/20 0249 07/20/20 0419 07/21/20 0630 07/22/20 0456  NA 137 136 137  --  136 135 137  K 3.6 3.6 3.4* 4.0 4.4 4.5 4.8  CL 99 97* 98  --  98 96* 99  CO2 27 28 28   --  29 30 30   GLUCOSE 279* 289* 262*  --  276* 263* 248*  BUN 26* 30* 44*  --  47* 47* 47*  CREATININE 0.57* 0.63 0.73  --  0.91 0.85 1.01  CALCIUM 10.5* 10.5* 11.3*  --  10.5* 10.1 9.7  MG 1.9 1.9 2.0  --  2.2 2.2  --   PHOS 5.3*  --  4.2  --  4.4 4.4  --     ABG: No results for input(s): PHART, PCO2ART, PO2ART, HCO3, O2SAT in the last 168 hours.  Liver Function Tests: Recent Labs  Lab 07/16/20 0609  AST 46*  ALT 32  ALKPHOS 117  BILITOT 0.6  PROT 7.6  ALBUMIN 2.4*   No results for input(s): LIPASE, AMYLASE in the last 168 hours. Recent Labs   Lab 07/21/20 0630  AMMONIA 47*    CBC: Recent Labs  Lab 07/16/20 0609 07/18/20 0430 07/20/20 0419 07/21/20 0630 07/22/20 0456  WBC 10.6* 11.1* 14.5* 14.5* 12.6*  HGB 10.3* 10.2* 10.3* 9.7* 9.7*  HCT 32.7* 32.1* 32.9* 31.5* 32.1*  MCV 95.1 92.0 92.7 93.5 94.7  PLT 409* 448* 464* 456* 405*    Cardiac Enzymes: No results for input(s): CKTOTAL, CKMB, CKMBINDEX, TROPONINI in the last 168 hours.  BNP (last 3 results) No results for input(s): BNP in the last 8760 hours.  ProBNP (last 3 results) No results for input(s): PROBNP in the last 8760 hours.  Radiological Exams: No results found.  Assessment/Plan Active Problems:   Acute on chronic respiratory failure with hypoxia (HCC)   Severe sepsis (HCC)   Obstructive sleep apnea   History of alcohol abuse   Acute renal failure due to tubular necrosis (HCC)   1. Acute on chronic respiratory failure hypoxia weaning on T collar for 2 hours today. 2. Severe sepsis treated slow improvement 3. Obstructive sleep apnea nonissue at this time. 4. Alcohol abuse no active withdrawal 5. Acute renal failure following lab   I have personally seen and evaluated  the patient, evaluated laboratory and imaging results, formulated the assessment and plan and placed orders. The Patient requires high complexity decision making with multiple systems involvement.  Rounds were done with the Respiratory Therapy Director and Staff therapists and discussed with nursing staff also.  Allyne Gee, MD [facility] Pulmonary Critical Care Medicine Sleep Medicine

## 2020-07-23 DIAGNOSIS — J9621 Acute and chronic respiratory failure with hypoxia: Secondary | ICD-10-CM | POA: Diagnosis not present

## 2020-07-23 DIAGNOSIS — F1011 Alcohol abuse, in remission: Secondary | ICD-10-CM | POA: Diagnosis not present

## 2020-07-23 DIAGNOSIS — N170 Acute kidney failure with tubular necrosis: Secondary | ICD-10-CM | POA: Diagnosis not present

## 2020-07-23 DIAGNOSIS — G4733 Obstructive sleep apnea (adult) (pediatric): Secondary | ICD-10-CM | POA: Diagnosis not present

## 2020-07-23 LAB — BLOOD GAS, ARTERIAL
Acid-Base Excess: 3 mmol/L — ABNORMAL HIGH (ref 0.0–2.0)
Acid-Base Excess: 2.7 mmol/L — ABNORMAL HIGH (ref 0.0–2.0)
Bicarbonate: 27.6 mmol/L (ref 20.0–28.0)
Bicarbonate: 27.3 mmol/L (ref 20.0–28.0)
FIO2: 50
FIO2: 60
O2 Saturation: 86.5 %
O2 Saturation: 89.5 %
Patient temperature: 38.1
Patient temperature: 37.4
pCO2 arterial: 49.2 mmHg — ABNORMAL HIGH (ref 32.0–48.0)
pCO2 arterial: 47.4 mmHg (ref 32.0–48.0)
pH, Arterial: 7.374 (ref 7.350–7.450)
pH, Arterial: 7.381 (ref 7.350–7.450)
pO2, Arterial: 59.4 mmHg — ABNORMAL LOW (ref 83.0–108.0)
pO2, Arterial: 62.2 mmHg — ABNORMAL LOW (ref 83.0–108.0)

## 2020-07-23 NOTE — Progress Notes (Signed)
Pulmonary Critical Care Medicine [facility] [place]   PULMONARY CRITICAL CARE SERVICE  PROGRESS NOTE     Alex Rojas  [id number]  DOB: June 30, 1970   DOA: 07/14/2020  Referring Physician: Carron Curie, MD  HPI: Alex Rojas is a 49 y.o. male seen for follow up of Acute on Chronic Respiratory Failure.  Patient currently is having a temperature temperature was up to 101.0 remains on T collar 50% FiO2  Medications: Reviewed on Rounds  Physical Exam:  Vitals: Temperature 101.0 pulse 88 respiratory 18 blood pressure is 102/59 saturations 95%  Ventilator Settings off the ventilator on T collar at this time on 50% FiO2  . General: Comfortable at this time . Eyes: Grossly normal lids, irises & conjunctiva . ENT: grossly tongue is normal . Neck: no obvious mass . Cardiovascular: S1 S2 normal no gallop . Respiratory: No rhonchi or rales are noted at this time . Abdomen: soft . Skin: no rash seen on limited exam . Musculoskeletal: not rigid . Psychiatric:unable to assess . Neurologic: no seizure no involuntary movements         Lab Data:   Basic Metabolic Panel: Recent Labs  Lab 07/17/20 0426 07/18/20 0430 07/19/20 0249 07/20/20 0419 07/21/20 0630 07/22/20 0456  NA 136 137  --  136 135 137  K 3.6 3.4* 4.0 4.4 4.5 4.8  CL 97* 98  --  98 96* 99  CO2 28 28  --  29 30 30   GLUCOSE 289* 262*  --  276* 263* 248*  BUN 30* 44*  --  47* 47* 47*  CREATININE 0.63 0.73  --  0.91 0.85 1.01  CALCIUM 10.5* 11.3*  --  10.5* 10.1 9.7  MG 1.9 2.0  --  2.2 2.2  --   PHOS  --  4.2  --  4.4 4.4  --     ABG: No results for input(s): PHART, PCO2ART, PO2ART, HCO3, O2SAT in the last 168 hours.  Liver Function Tests: No results for input(s): AST, ALT, ALKPHOS, BILITOT, PROT, ALBUMIN in the last 168 hours. No results for input(s): LIPASE, AMYLASE in the last 168 hours. Recent Labs  Lab 07/21/20 0630  AMMONIA 47*    CBC: Recent Labs  Lab  07/18/20 0430 07/20/20 0419 07/21/20 0630 07/22/20 0456  WBC 11.1* 14.5* 14.5* 12.6*  HGB 10.2* 10.3* 9.7* 9.7*  HCT 32.1* 32.9* 31.5* 32.1*  MCV 92.0 92.7 93.5 94.7  PLT 448* 464* 456* 405*    Cardiac Enzymes: No results for input(s): CKTOTAL, CKMB, CKMBINDEX, TROPONINI in the last 168 hours.  BNP (last 3 results) No results for input(s): BNP in the last 8760 hours.  ProBNP (last 3 results) No results for input(s): PROBNP in the last 8760 hours.  Radiological Exams: No results found.  Assessment/Plan Active Problems:   Acute on chronic respiratory failure with hypoxia (HCC)   Severe sepsis (HCC)   Obstructive sleep apnea   History of alcohol abuse   Acute renal failure due to tubular necrosis (HCC)   1. Acute on chronic respiratory failure hypoxia plan is going to be to continue with the T-piece because of the temperature we may need to slow down the progression of the weaning. 2. Severe sepsis treated in resolution 3. Obstructive sleep apnea no change 4. History of alcohol abuse patient is at baseline 5. Acute renal failure tubular necrosis we will continue to monitor.   I have personally seen and evaluated the patient, evaluated laboratory and imaging results, formulated the  assessment and plan and placed orders. The Patient requires high complexity decision making with multiple systems involvement.  Rounds were done with the Respiratory Therapy Director and Staff therapists and discussed with nursing staff also.  Allyne Gee, MD [facility] Pulmonary Critical Care Medicine Sleep Medicine

## 2020-07-24 ENCOUNTER — Other Ambulatory Visit (HOSPITAL_COMMUNITY): Payer: No Typology Code available for payment source

## 2020-07-24 DIAGNOSIS — G4733 Obstructive sleep apnea (adult) (pediatric): Secondary | ICD-10-CM | POA: Diagnosis not present

## 2020-07-24 DIAGNOSIS — J9621 Acute and chronic respiratory failure with hypoxia: Secondary | ICD-10-CM | POA: Diagnosis not present

## 2020-07-24 DIAGNOSIS — N170 Acute kidney failure with tubular necrosis: Secondary | ICD-10-CM | POA: Diagnosis not present

## 2020-07-24 DIAGNOSIS — F1011 Alcohol abuse, in remission: Secondary | ICD-10-CM | POA: Diagnosis not present

## 2020-07-24 LAB — MAGNESIUM: Magnesium: 2.1 mg/dL (ref 1.7–2.4)

## 2020-07-24 LAB — HEPATIC FUNCTION PANEL
ALT: 21 U/L (ref 0–44)
AST: 21 U/L (ref 15–41)
Albumin: 2.3 g/dL — ABNORMAL LOW (ref 3.5–5.0)
Alkaline Phosphatase: 71 U/L (ref 38–126)
Bilirubin, Direct: <0.1 mg/dL (ref 0.0–0.2)
Total Bilirubin: 0.4 mg/dL (ref 0.3–1.2)
Total Protein: 7 g/dL (ref 6.5–8.1)

## 2020-07-24 LAB — BLOOD GAS, ARTERIAL
Acid-Base Excess: 3 mmol/L — ABNORMAL HIGH (ref 0.0–2.0)
Bicarbonate: 27.9 mmol/L (ref 20.0–28.0)
FIO2: 65
O2 Saturation: 91 %
Patient temperature: 38
pCO2 arterial: 53 mmHg — ABNORMAL HIGH (ref 32.0–48.0)
pH, Arterial: 7.347 — ABNORMAL LOW (ref 7.350–7.450)
pO2, Arterial: 69.2 mmHg — ABNORMAL LOW (ref 83.0–108.0)

## 2020-07-24 LAB — CBC
HCT: 31.9 % — ABNORMAL LOW (ref 39.0–52.0)
Hemoglobin: 9.5 g/dL — ABNORMAL LOW (ref 13.0–17.0)
MCH: 28.7 pg (ref 26.0–34.0)
MCHC: 29.8 g/dL — ABNORMAL LOW (ref 30.0–36.0)
MCV: 96.4 fL (ref 80.0–100.0)
Platelets: 398 10*3/uL (ref 150–400)
RBC: 3.31 MIL/uL — ABNORMAL LOW (ref 4.22–5.81)
RDW: 17.1 % — ABNORMAL HIGH (ref 11.5–15.5)
WBC: 14.6 10*3/uL — ABNORMAL HIGH (ref 4.0–10.5)
nRBC: 0.2 % (ref 0.0–0.2)

## 2020-07-24 LAB — BASIC METABOLIC PANEL
Anion gap: 8 (ref 5–15)
BUN: 49 mg/dL — ABNORMAL HIGH (ref 6–20)
CO2: 26 mmol/L (ref 22–32)
Calcium: 9.6 mg/dL (ref 8.9–10.3)
Chloride: 106 mmol/L (ref 98–111)
Creatinine, Ser: 0.88 mg/dL (ref 0.61–1.24)
GFR, Estimated: >60 mL/min (ref >60)
Glucose, Bld: 244 mg/dL — ABNORMAL HIGH (ref 70–99)
Potassium: 4.9 mmol/L (ref 3.5–5.1)
Sodium: 140 mmol/L (ref 135–145)

## 2020-07-24 LAB — TRIGLYCERIDES: Triglycerides: 618 mg/dL — ABNORMAL HIGH (ref <150)

## 2020-07-24 LAB — VANCOMYCIN, TROUGH: Vancomycin Tr: 8 ug/mL — ABNORMAL LOW (ref 15–20)

## 2020-07-24 NOTE — Progress Notes (Signed)
Pulmonary Critical Care Medicine [facility] [place]   PULMONARY CRITICAL CARE SERVICE  PROGRESS NOTE     Alex Rojas  [id number]  DOB: 1971-02-25   DOA: 07/14/2020  Referring Physician: Carron Curie, MD  HPI: Alex Rojas is a 50 y.o. male seen for follow up of Acute on Chronic Respiratory Failure.  Patient is comfortable right now without distress has been on full support on the ventilator currently on assist control mode  Medications: Reviewed on Rounds  Physical Exam:  Vitals: Temperature is 100.9 pulse 121 respiratory 20 blood pressure is 119/65 saturations 95%  Ventilator Settings on assist control FiO2 65% tidal volume 500 PEEP 8  . General: Comfortable at this time . Eyes: Grossly normal lids, irises & conjunctiva . ENT: grossly tongue is normal . Neck: no obvious mass . Cardiovascular: S1 S2 normal no gallop . Respiratory: No rhonchi no rales are noted at this time . Abdomen: soft . Skin: no rash seen on limited exam . Musculoskeletal: not rigid . Psychiatric:unable to assess . Neurologic: no seizure no involuntary movements         Lab Data:   Basic Metabolic Panel: Recent Labs  Lab 07/18/20 0430 07/19/20 0249 07/20/20 0419 07/21/20 0630 07/22/20 0456 07/24/20 0512  NA 137  --  136 135 137 140  K 3.4* 4.0 4.4 4.5 4.8 4.9  CL 98  --  98 96* 99 106  CO2 28  --  29 30 30 26   GLUCOSE 262*  --  276* 263* 248* 244*  BUN 44*  --  47* 47* 47* 49*  CREATININE 0.73  --  0.91 0.85 1.01 0.88  CALCIUM 11.3*  --  10.5* 10.1 9.7 9.6  MG 2.0  --  2.2 2.2  --  2.1  PHOS 4.2  --  4.4 4.4  --   --     ABG: Recent Labs  Lab 07/23/20 1635 07/23/20 2130 07/24/20 0736  PHART 7.374 7.381 7.347*  PCO2ART 49.2* 47.4 53.0*  PO2ART 59.4* 62.2* 69.2*  HCO3 27.6 27.3 27.9  O2SAT 86.5 89.5 91.0    Liver Function Tests: Recent Labs  Lab 07/24/20 0512  AST 21  ALT 21  ALKPHOS 71  BILITOT 0.4  PROT 7.0  ALBUMIN 2.3*   No  results for input(s): LIPASE, AMYLASE in the last 168 hours. Recent Labs  Lab 07/21/20 0630  AMMONIA 47*    CBC: Recent Labs  Lab 07/18/20 0430 07/20/20 0419 07/21/20 0630 07/22/20 0456 07/24/20 0512  WBC 11.1* 14.5* 14.5* 12.6* 14.6*  HGB 10.2* 10.3* 9.7* 9.7* 9.5*  HCT 32.1* 32.9* 31.5* 32.1* 31.9*  MCV 92.0 92.7 93.5 94.7 96.4  PLT 448* 464* 456* 405* 398    Cardiac Enzymes: No results for input(s): CKTOTAL, CKMB, CKMBINDEX, TROPONINI in the last 168 hours.  BNP (last 3 results) No results for input(s): BNP in the last 8760 hours.  ProBNP (last 3 results) No results for input(s): PROBNP in the last 8760 hours.  Radiological Exams: No results found.  Assessment/Plan Active Problems:   Acute on chronic respiratory failure with hypoxia (HCC)   Severe sepsis (HCC)   Obstructive sleep apnea   History of alcohol abuse   Acute renal failure due to tubular necrosis (HCC)   1. Acute on chronic respiratory failure hypoxia plan is currently to continue with full support right now patient is requiring 65% FiO2 try to titrate down as tolerated. 2. Severe sepsis treated slow improvement 3. Obstructive sleep apnea  no change 4. History of alcohol abuse patient is at baseline 5. Acute renal failure following labs.   I have personally seen and evaluated the patient, evaluated laboratory and imaging results, formulated the assessment and plan and placed orders. The Patient requires high complexity decision making with multiple systems involvement.  Rounds were done with the Respiratory Therapy Director and Staff therapists and discussed with nursing staff also.  Yevonne Pax, MD [facility] Pulmonary Critical Care Medicine Sleep Medicine

## 2020-07-25 DIAGNOSIS — F1011 Alcohol abuse, in remission: Secondary | ICD-10-CM | POA: Diagnosis not present

## 2020-07-25 DIAGNOSIS — J9621 Acute and chronic respiratory failure with hypoxia: Secondary | ICD-10-CM | POA: Diagnosis not present

## 2020-07-25 DIAGNOSIS — N170 Acute kidney failure with tubular necrosis: Secondary | ICD-10-CM | POA: Diagnosis not present

## 2020-07-25 DIAGNOSIS — G4733 Obstructive sleep apnea (adult) (pediatric): Secondary | ICD-10-CM | POA: Diagnosis not present

## 2020-07-25 LAB — BLOOD GAS, ARTERIAL
Acid-Base Excess: 3.8 mmol/L — ABNORMAL HIGH (ref 0.0–2.0)
Bicarbonate: 28.8 mmol/L — ABNORMAL HIGH (ref 20.0–28.0)
FIO2: 80
O2 Saturation: 96.2 %
Patient temperature: 37.2
pCO2 arterial: 51.8 mmHg — ABNORMAL HIGH (ref 32.0–48.0)
pH, Arterial: 7.365 (ref 7.350–7.450)
pO2, Arterial: 85.7 mmHg (ref 83.0–108.0)

## 2020-07-25 LAB — VANCOMYCIN, TROUGH: Vancomycin Tr: 29 ug/mL (ref 15–20)

## 2020-07-25 LAB — CULTURE, BLOOD (ROUTINE X 2): Culture: NO GROWTH

## 2020-07-25 LAB — SARS CORONAVIRUS 2 (TAT 6-24 HRS): SARS Coronavirus 2: NEGATIVE

## 2020-07-25 NOTE — Progress Notes (Signed)
Pulmonary Critical Care Medicine [facility] [place]   PULMONARY CRITICAL CARE SERVICE  PROGRESS NOTE     Alex Rojas  [id number]:[id number]  DOB: 1971-01-24   DOA: 07/14/2020  Referring Physician: Carron Curie, MD  HPI: Alex Rojas is a 50 y.o. male seen for follow up of Acute on Chronic Respiratory Failure.  Patient currently is on assist control mode has been on 80% FiO2 oxygen requirements have been going up  Medications: Reviewed on Rounds  Physical Exam:  Vitals: Temperature is 99.2 pulse 115 respiratory rate 14 blood pressure is 137/77 saturations 91%  Ventilator Settings assist control FiO2 80% tidal volume 500 PEEP 5  . General: Comfortable at this time . Eyes: Grossly normal lids, irises & conjunctiva . ENT: grossly tongue is normal . Neck: no obvious mass . Cardiovascular: S1 S2 normal no gallop . Respiratory: No rhonchi no rales noted at this time . Abdomen: soft . Skin: no rash seen on limited exam . Musculoskeletal: not rigid . Psychiatric:unable to assess . Neurologic: no seizure no involuntary movements         Lab Data:   Basic Metabolic Panel: Recent Labs  Lab 07/19/20 0249 07/20/20 0419 07/21/20 0630 07/22/20 0456 07/24/20 0512  NA  --  136 135 137 140  K 4.0 4.4 4.5 4.8 4.9  CL  --  98 96* 99 106  CO2  --  29 30 30 26   GLUCOSE  --  276* 263* 248* 244*  BUN  --  47* 47* 47* 49*  CREATININE  --  0.91 0.85 1.01 0.88  CALCIUM  --  10.5* 10.1 9.7 9.6  MG  --  2.2 2.2  --  2.1  PHOS  --  4.4 4.4  --   --     ABG: Recent Labs  Lab 07/23/20 1635 07/23/20 2130 07/24/20 0736 07/25/20 1030  PHART 7.374 7.381 7.347* 7.365  PCO2ART 49.2* 47.4 53.0* 51.8*  PO2ART 59.4* 62.2* 69.2* 85.7  HCO3 27.6 27.3 27.9 28.8*  O2SAT 86.5 89.5 91.0 96.2    Liver Function Tests: Recent Labs  Lab 07/24/20 0512  AST 21  ALT 21  ALKPHOS 71  BILITOT 0.4  PROT 7.0  ALBUMIN 2.3*   No results for input(s): LIPASE, AMYLASE in  the last 168 hours. Recent Labs  Lab 07/21/20 0630  AMMONIA 47*    CBC: Recent Labs  Lab 07/20/20 0419 07/21/20 0630 07/22/20 0456 07/24/20 0512  WBC 14.5* 14.5* 12.6* 14.6*  HGB 10.3* 9.7* 9.7* 9.5*  HCT 32.9* 31.5* 32.1* 31.9*  MCV 92.7 93.5 94.7 96.4  PLT 464* 456* 405* 398    Cardiac Enzymes: No results for input(s): CKTOTAL, CKMB, CKMBINDEX, TROPONINI in the last 168 hours.  BNP (last 3 results) No results for input(s): BNP in the last 8760 hours.  ProBNP (last 3 results) No results for input(s): PROBNP in the last 8760 hours.  Radiological Exams: CT CHEST ABDOMEN PELVIS WO CONTRAST  Result Date: 07/24/2020 CLINICAL DATA:  Fever. Abdominal pain. Sepsis. Acute renal failure. Acute on chronic respiratory failure. EXAM: CT CHEST, ABDOMEN AND PELVIS WITHOUT CONTRAST TECHNIQUE: Multidetector CT imaging of the chest, abdomen and pelvis was performed following the standard protocol without IV contrast. COMPARISON:  Chest radiograph 07/20/2020 and abdominal radiograph 07/14/2020 FINDINGS: CT CHEST FINDINGS Cardiovascular: Right PICC line tip: SVC. Aortic arch atherosclerosis. Mild cardiomegaly. Low-density blood pool compatible with anemia. Mediastinum/Nodes: Right paratracheal node measures 1.3 cm in short axis on image 18 of series 3,  mildly abnormally enlarged. A partially calcified right paratracheal lymph node measures 1.1 cm in short axis on image 25 series 3. Lungs/Pleura: Moderate left and small right pleural effusions. Tracheostomy tube in place. Both effusions are accompanied by passive atelectasis and on the left side there appears to be atelectasis somewhat disproportionate to the size of the effusions. A component of left lower lobe pneumonia is difficult to exclude although most of the left lung opacity is due to volume loss. Musculoskeletal: Unremarkable CT ABDOMEN PELVIS FINDINGS Hepatobiliary: Cholecystectomy.  Otherwise unremarkable. Pancreas: Unremarkable Spleen:  Unremarkable Adrenals/Urinary Tract: Both adrenal glands appear normal. There is a Foley catheter present in the decompressed urinary bladder. No hydronephrosis or hydroureter. No focal renal lesion is apparent on today's noncontrast exam. Stomach/Bowel: Peg tube noted. A rectal tube is in place with air-levels in the distal colon indicating liquid contents and possible diarrheal process. Normal appendix. No dilated bowel. Vascular/Lymphatic: Aortoiliac atherosclerotic vascular disease. There are numerous scattered small and borderline enlarged porta hepatis and retroperitoneal lymph nodes. One lymph node anterior to the abdominal aorta on image 80 of series 3 measures 1.0 cm in short axis. A portacaval lymph node measures 1.5 cm in short axis on image 74 series 3. These lymph nodes may well be reactive but are nonspecific. Reproductive: Unremarkable Other: No supplemental non-categorized findings. Musculoskeletal: Lower lumbar spondylosis and degenerative disc disease. 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 IMPRESSION: 1. Moderate left and small right pleural effusion with both passive atelectasis and additional atelectasis particularly on the left. A component of pneumonia on the left cannot be readily excluded although most of the airspace opacity is from volume loss. 2. Numerous small and borderline enlarged lymph nodes in the retroperitoneum and porta hepatis, probably reactive although technically nonspecific. There is also a mildly enlarged right paratracheal lymph node in the chest. 3. Other imaging findings of potential clinical significance: Aortic Atherosclerosis (ICD10-I70.0). Mild cardiomegaly. Low-density blood pool compatible with anemia. Lumbar impingement at L5-S1 with transitional S1 vertebra. Electronically Signed   By: Gaylyn Rong M.D.   On: 07/24/2020 17:07    Assessment/Plan Active Problems:   Acute on  chronic respiratory failure with hypoxia (HCC)   Severe sepsis (HCC)   Obstructive sleep apnea   History of alcohol abuse   Acute renal failure due to tubular necrosis (HCC)   1. Acute on chronic respiratory failure with hypoxia plan is to continue with full support on the ventilator right now patient's oxygen requirements are too high. 2. Severe sepsis treated in resolution 3. Obstructive sleep apnea nonissue 4. Alcohol abuse patient is at baseline 5. Acute renal failure tubular necrosis no change we will continue to follow along   I have personally seen and evaluated the patient, evaluated laboratory and imaging results, formulated the assessment and plan and placed orders. The Patient requires high complexity decision making with multiple systems involvement.  Rounds were done with the Respiratory Therapy Director and Staff therapists and discussed with nursing staff also.  Yevonne Pax, MD [facility] Pulmonary Critical Care Medicine Sleep Medicine

## 2020-07-26 DIAGNOSIS — G4733 Obstructive sleep apnea (adult) (pediatric): Secondary | ICD-10-CM | POA: Diagnosis not present

## 2020-07-26 DIAGNOSIS — J9621 Acute and chronic respiratory failure with hypoxia: Secondary | ICD-10-CM | POA: Diagnosis not present

## 2020-07-26 DIAGNOSIS — F1011 Alcohol abuse, in remission: Secondary | ICD-10-CM | POA: Diagnosis not present

## 2020-07-26 DIAGNOSIS — N170 Acute kidney failure with tubular necrosis: Secondary | ICD-10-CM | POA: Diagnosis not present

## 2020-07-26 LAB — VANCOMYCIN, TROUGH: Vancomycin Tr: 21 ug/mL (ref 15–20)

## 2020-07-26 LAB — CBC
HCT: 30.8 % — ABNORMAL LOW (ref 39.0–52.0)
Hemoglobin: 9 g/dL — ABNORMAL LOW (ref 13.0–17.0)
MCH: 28.6 pg (ref 26.0–34.0)
MCHC: 29.2 g/dL — ABNORMAL LOW (ref 30.0–36.0)
MCV: 97.8 fL (ref 80.0–100.0)
Platelets: 442 10*3/uL — ABNORMAL HIGH (ref 150–400)
RBC: 3.15 MIL/uL — ABNORMAL LOW (ref 4.22–5.81)
RDW: 17.2 % — ABNORMAL HIGH (ref 11.5–15.5)
WBC: 12 10*3/uL — ABNORMAL HIGH (ref 4.0–10.5)
nRBC: 0.2 % (ref 0.0–0.2)

## 2020-07-26 LAB — BASIC METABOLIC PANEL
Anion gap: 8 (ref 5–15)
BUN: 45 mg/dL — ABNORMAL HIGH (ref 6–20)
CO2: 30 mmol/L (ref 22–32)
Calcium: 10.1 mg/dL (ref 8.9–10.3)
Chloride: 107 mmol/L (ref 98–111)
Creatinine, Ser: 0.87 mg/dL (ref 0.61–1.24)
GFR, Estimated: >60 mL/min (ref >60)
Glucose, Bld: 147 mg/dL — ABNORMAL HIGH (ref 70–99)
Potassium: 4.4 mmol/L (ref 3.5–5.1)
Sodium: 145 mmol/L (ref 135–145)

## 2020-07-26 LAB — MAGNESIUM: Magnesium: 1.9 mg/dL (ref 1.7–2.4)

## 2020-07-26 NOTE — Progress Notes (Signed)
Pulmonary Critical Care Medicine [facility] [place]   PULMONARY CRITICAL CARE SERVICE  PROGRESS NOTE     Alex Rojas  PPJ:[id number]  DOB: 03-Oct-1970   DOA: 07/14/2020  Referring Physician: Carron Curie, MD  HPI: Alex Rojas is a 50 y.o. male seen for follow up of Acute on Chronic Respiratory Failure.  Patient at this time is resting comfortably without distress has been on assist control mode requiring 80% FiO2 rate now  Medications: Reviewed on Rounds  Physical Exam:  Vitals: Temperature is 98.7 pulse 99 respiratory rate is 15 blood pressure is 114/62 saturations 99%  Ventilator Settings on assist control FiO2 80% tidal volume 500 PEEP 8  . General: Comfortable at this time . Eyes: Grossly normal lids, irises & conjunctiva . ENT: grossly tongue is normal . Neck: no obvious mass . Cardiovascular: S1 S2 normal no gallop . Respiratory: Scattered rhonchi expansion is equal . Abdomen: soft . Skin: no rash seen on limited exam . Musculoskeletal: not rigid . Psychiatric:unable to assess . Neurologic: no seizure no involuntary movements         Lab Data:   Basic Metabolic Panel: Recent Labs  Lab 07/20/20 0419 07/21/20 0630 07/22/20 0456 07/24/20 0512 07/26/20 0421  NA 136 135 137 140 145  K 4.4 4.5 4.8 4.9 4.4  CL 98 96* 99 106 107  CO2 29 30 30 26 30   GLUCOSE 276* 263* 248* 244* 147*  BUN 47* 47* 47* 49* 45*  CREATININE 0.91 0.85 1.01 0.88 0.87  CALCIUM 10.5* 10.1 9.7 9.6 10.1  MG 2.2 2.2  --  2.1 1.9  PHOS 4.4 4.4  --   --   --     ABG: Recent Labs  Lab 07/23/20 1635 07/23/20 2130 07/24/20 0736 07/25/20 1030  PHART 7.374 7.381 7.347* 7.365  PCO2ART 49.2* 47.4 53.0* 51.8*  PO2ART 59.4* 62.2* 69.2* 85.7  HCO3 27.6 27.3 27.9 28.8*  O2SAT 86.5 89.5 91.0 96.2    Liver Function Tests: Recent Labs  Lab 07/24/20 0512  AST 21  ALT 21  ALKPHOS 71  BILITOT 0.4  PROT 7.0  ALBUMIN 2.3*   No results for input(s):  LIPASE, AMYLASE in the last 168 hours. Recent Labs  Lab 07/21/20 0630  AMMONIA 47*    CBC: Recent Labs  Lab 07/20/20 0419 07/21/20 0630 07/22/20 0456 07/24/20 0512 07/26/20 0421  WBC 14.5* 14.5* 12.6* 14.6* 12.0*  HGB 10.3* 9.7* 9.7* 9.5* 9.0*  HCT 32.9* 31.5* 32.1* 31.9* 30.8*  MCV 92.7 93.5 94.7 96.4 97.8  PLT 464* 456* 405* 398 442*    Cardiac Enzymes: No results for input(s): CKTOTAL, CKMB, CKMBINDEX, TROPONINI in the last 168 hours.  BNP (last 3 results) No results for input(s): BNP in the last 8760 hours.  ProBNP (last 3 results) No results for input(s): PROBNP in the last 8760 hours.  Radiological Exams: CT CHEST ABDOMEN PELVIS WO CONTRAST  Result Date: 07/24/2020 CLINICAL DATA:  Fever. Abdominal pain. Sepsis. Acute renal failure. Acute on chronic respiratory failure. EXAM: CT CHEST, ABDOMEN AND PELVIS WITHOUT CONTRAST TECHNIQUE: Multidetector CT imaging of the chest, abdomen and pelvis was performed following the standard protocol without IV contrast. COMPARISON:  Chest radiograph 07/20/2020 and abdominal radiograph 07/14/2020 FINDINGS: CT CHEST FINDINGS Cardiovascular: Right PICC line tip: SVC. Aortic arch atherosclerosis. Mild cardiomegaly. Low-density blood pool compatible with anemia. Mediastinum/Nodes: Right paratracheal node measures 1.3 cm in short axis on image 18 of series 3, mildly abnormally enlarged. A partially calcified right paratracheal  lymph node measures 1.1 cm in short axis on image 25 series 3. Lungs/Pleura: Moderate left and small right pleural effusions. Tracheostomy tube in place. Both effusions are accompanied by passive atelectasis and on the left side there appears to be atelectasis somewhat disproportionate to the size of the effusions. A component of left lower lobe pneumonia is difficult to exclude although most of the left lung opacity is due to volume loss. Musculoskeletal: Unremarkable CT ABDOMEN PELVIS FINDINGS Hepatobiliary: Cholecystectomy.   Otherwise unremarkable. Pancreas: Unremarkable Spleen: Unremarkable Adrenals/Urinary Tract: Both adrenal glands appear normal. There is a Foley catheter present in the decompressed urinary bladder. No hydronephrosis or hydroureter. No focal renal lesion is apparent on today's noncontrast exam. Stomach/Bowel: Peg tube noted. A rectal tube is in place with air-levels in the distal colon indicating liquid contents and possible diarrheal process. Normal appendix. No dilated bowel. Vascular/Lymphatic: Aortoiliac atherosclerotic vascular disease. There are numerous scattered small and borderline enlarged porta hepatis and retroperitoneal lymph nodes. One lymph node anterior to the abdominal aorta on image 80 of series 3 measures 1.0 cm in short axis. A portacaval lymph node measures 1.5 cm in short axis on image 74 series 3. These lymph nodes may well be reactive but are nonspecific. Reproductive: Unremarkable Other: No supplemental non-categorized findings. Musculoskeletal: Lower lumbar spondylosis and degenerative disc disease. 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 IMPRESSION: 1. Moderate left and small right pleural effusion with both passive atelectasis and additional atelectasis particularly on the left. A component of pneumonia on the left cannot be readily excluded although most of the airspace opacity is from volume loss. 2. Numerous small and borderline enlarged lymph nodes in the retroperitoneum and porta hepatis, probably reactive although technically nonspecific. There is also a mildly enlarged right paratracheal lymph node in the chest. 3. Other imaging findings of potential clinical significance: Aortic Atherosclerosis (ICD10-I70.0). Mild cardiomegaly. Low-density blood pool compatible with anemia. Lumbar impingement at L5-S1 with transitional S1 vertebra. Electronically Signed   By: Gaylyn Rong M.D.   On: 07/24/2020  17:07    Assessment/Plan Active Problems:   Acute on chronic respiratory failure with hypoxia (HCC)   Severe sepsis (HCC)   Obstructive sleep apnea   History of alcohol abuse   Acute renal failure due to tubular necrosis (HCC)   1. Acute on chronic respiratory failure hypoxia we will continue with assist control mode patient is on 80% FiO2. 2. Severe sepsis treated improving 3. Obstructive sleep apnea no change we will continue to follow 4. Alcohol abuse patient is at baseline 5. Acute renal failure no change we will continue with supportive care   I have personally seen and evaluated the patient, evaluated laboratory and imaging results, formulated the assessment and plan and placed orders. The Patient requires high complexity decision making with multiple systems involvement.  Rounds were done with the Respiratory Therapy Director and Staff therapists and discussed with nursing staff also.  Yevonne Pax, MD [facility] Pulmonary Critical Care Medicine Sleep Medicine

## 2020-07-27 LAB — BASIC METABOLIC PANEL
Anion gap: 8 (ref 5–15)
BUN: 42 mg/dL — ABNORMAL HIGH (ref 6–20)
CO2: 30 mmol/L (ref 22–32)
Calcium: 10 mg/dL (ref 8.9–10.3)
Chloride: 107 mmol/L (ref 98–111)
Creatinine, Ser: 0.78 mg/dL (ref 0.61–1.24)
GFR, Estimated: >60 mL/min (ref >60)
Glucose, Bld: 133 mg/dL — ABNORMAL HIGH (ref 70–99)
Potassium: 5.5 mmol/L — ABNORMAL HIGH (ref 3.5–5.1)
Sodium: 145 mmol/L (ref 135–145)

## 2020-07-27 LAB — BLOOD GAS, ARTERIAL
Acid-Base Excess: 4.7 mmol/L — ABNORMAL HIGH (ref 0.0–2.0)
Bicarbonate: 30 mmol/L — ABNORMAL HIGH (ref 20.0–28.0)
FIO2: 80
O2 Saturation: 95.4 %
Patient temperature: 36.6
pCO2 arterial: 55 mmHg — ABNORMAL HIGH (ref 32.0–48.0)
pH, Arterial: 7.353 (ref 7.350–7.450)
pO2, Arterial: 80 mmHg — ABNORMAL LOW (ref 83.0–108.0)

## 2020-07-27 LAB — CULTURE, BLOOD (ROUTINE X 2)
Culture: NO GROWTH
Culture: NO GROWTH
Special Requests: ADEQUATE
Special Requests: ADEQUATE

## 2020-07-27 LAB — POTASSIUM: Potassium: 3.9 mmol/L (ref 3.5–5.1)

## 2020-07-27 LAB — TRIGLYCERIDES: Triglycerides: 692 mg/dL — ABNORMAL HIGH (ref <150)

## 2020-07-28 ENCOUNTER — Other Ambulatory Visit (HOSPITAL_COMMUNITY): Payer: No Typology Code available for payment source

## 2020-07-28 DIAGNOSIS — F1011 Alcohol abuse, in remission: Secondary | ICD-10-CM | POA: Diagnosis not present

## 2020-07-28 DIAGNOSIS — G4733 Obstructive sleep apnea (adult) (pediatric): Secondary | ICD-10-CM | POA: Diagnosis not present

## 2020-07-28 DIAGNOSIS — J9621 Acute and chronic respiratory failure with hypoxia: Secondary | ICD-10-CM | POA: Diagnosis not present

## 2020-07-28 DIAGNOSIS — N170 Acute kidney failure with tubular necrosis: Secondary | ICD-10-CM | POA: Diagnosis not present

## 2020-07-28 LAB — BLOOD GAS, ARTERIAL
Acid-Base Excess: 6.4 mmol/L — ABNORMAL HIGH (ref 0.0–2.0)
Bicarbonate: 31.2 mmol/L — ABNORMAL HIGH (ref 20.0–28.0)
FIO2: 60
O2 Saturation: 88.2 %
Patient temperature: 36.5
pCO2 arterial: 50.2 mmHg — ABNORMAL HIGH (ref 32.0–48.0)
pH, Arterial: 7.407 (ref 7.350–7.450)
pO2, Arterial: 55.3 mmHg — ABNORMAL LOW (ref 83.0–108.0)

## 2020-07-28 LAB — CBC
HCT: 31.9 % — ABNORMAL LOW (ref 39.0–52.0)
Hemoglobin: 9.3 g/dL — ABNORMAL LOW (ref 13.0–17.0)
MCH: 28.4 pg (ref 26.0–34.0)
MCHC: 29.2 g/dL — ABNORMAL LOW (ref 30.0–36.0)
MCV: 97.6 fL (ref 80.0–100.0)
Platelets: 490 10*3/uL — ABNORMAL HIGH (ref 150–400)
RBC: 3.27 MIL/uL — ABNORMAL LOW (ref 4.22–5.81)
RDW: 17 % — ABNORMAL HIGH (ref 11.5–15.5)
WBC: 10.4 10*3/uL (ref 4.0–10.5)
nRBC: 0.2 % (ref 0.0–0.2)

## 2020-07-28 LAB — BASIC METABOLIC PANEL
Anion gap: 9 (ref 5–15)
BUN: 34 mg/dL — ABNORMAL HIGH (ref 6–20)
CO2: 30 mmol/L (ref 22–32)
Calcium: 9.8 mg/dL (ref 8.9–10.3)
Chloride: 109 mmol/L (ref 98–111)
Creatinine, Ser: 0.72 mg/dL (ref 0.61–1.24)
GFR, Estimated: >60 mL/min (ref >60)
Glucose, Bld: 182 mg/dL — ABNORMAL HIGH (ref 70–99)
Potassium: 3.6 mmol/L (ref 3.5–5.1)
Sodium: 148 mmol/L — ABNORMAL HIGH (ref 135–145)

## 2020-07-28 LAB — MAGNESIUM: Magnesium: 1.9 mg/dL (ref 1.7–2.4)

## 2020-07-28 NOTE — Progress Notes (Addendum)
Pulmonary Critical Care Medicine [facility] [place]   PULMONARY CRITICAL CARE SERVICE  PROGRESS NOTE     Alex Rojas  JGG:[id number]  DOB: December 30, 1970   DOA: 07/14/2020  Referring Physician: Carron Curie, MD  HPI: Alex Rojas is a 50 y.o. male seen for follow up of Acute on Chronic Respiratory Failure.  Patient currently is on assist control mode has been on 70% FiO2 Medications: Reviewed on Rounds  Physical Exam:  Vitals: Temperature is 99.4 pulse 72 respiratory rate is 28 blood pressure is 114/57 saturations 95%  Ventilator Settings on assist control FiO2 70% tidal volume is 500 PEEP 5  . General: Comfortable at this time . Eyes: Grossly normal lids, irises & conjunctiva . ENT: grossly tongue is normal . Neck: no obvious mass . Cardiovascular: S1 S2 normal no gallop . Respiratory: No rhonchi very coarse breath sounds . Abdomen: soft . Skin: no rash seen on limited exam . Musculoskeletal: not rigid . Psychiatric:unable to assess . Neurologic: no seizure no involuntary movements         Lab Data:   Basic Metabolic Panel: Recent Labs  Lab 07/22/20 0456 07/24/20 0512 07/26/20 0421 07/27/20 0453 07/27/20 1014 07/28/20 0654  NA 137 140 145 145  --  148*  K 4.8 4.9 4.4 5.5* 3.9 3.6  CL 99 106 107 107  --  109  CO2 30 26 30 30   --  30  GLUCOSE 248* 244* 147* 133*  --  182*  BUN 47* 49* 45* 42*  --  34*  CREATININE 1.01 0.88 0.87 0.78  --  0.72  CALCIUM 9.7 9.6 10.1 10.0  --  9.8  MG  --  2.1 1.9  --   --  1.9    ABG: Recent Labs  Lab 07/23/20 2130 07/24/20 0736 07/25/20 1030 07/27/20 0735 07/28/20 0528  PHART 7.381 7.347* 7.365 7.353 7.407  PCO2ART 47.4 53.0* 51.8* 55.0* 50.2*  PO2ART 62.2* 69.2* 85.7 80.0* 55.3*  HCO3 27.3 27.9 28.8* 30.0* 31.2*  O2SAT 89.5 91.0 96.2 95.4 88.2    Liver Function Tests: Recent Labs  Lab 07/24/20 0512  AST 21  ALT 21  ALKPHOS 71  BILITOT 0.4  PROT 7.0  ALBUMIN 2.3*   No  results for input(s): LIPASE, AMYLASE in the last 168 hours. No results for input(s): AMMONIA in the last 168 hours.  CBC: Recent Labs  Lab 07/22/20 0456 07/24/20 0512 07/26/20 0421 07/28/20 0654  WBC 12.6* 14.6* 12.0* 10.4  HGB 9.7* 9.5* 9.0* 9.3*  HCT 32.1* 31.9* 30.8* 31.9*  MCV 94.7 96.4 97.8 97.6  PLT 405* 398 442* 490*    Cardiac Enzymes: No results for input(s): CKTOTAL, CKMB, CKMBINDEX, TROPONINI in the last 168 hours.  BNP (last 3 results) No results for input(s): BNP in the last 8760 hours.  ProBNP (last 3 results) No results for input(s): PROBNP in the last 8760 hours.  Radiological Exams: DG Chest Port 1 View  Result Date: 07/28/2020 CLINICAL DATA:  50 year old male with sepsis. EXAM: PORTABLE CHEST 1 VIEW COMPARISON:  CT Chest, Abdomen, and Pelvis 07/24/2020 and earlier. FINDINGS: Portable AP semi upright view at 0632 hours. Stable tracheostomy and PICC line. Normal cardiac size and mediastinal contours. Mildly improved lung volumes with ventilation not significantly changed from 07/20/2020. Dense lower lobe collapse or consolidation greater on the left. Small pleural effusions better demonstrated by CT. No pneumothorax or pulmonary edema. No acute osseous abnormality identified. IMPRESSION: No significant change since 07/20/2020. Dense lower lobe  collapse or consolidation greater on the left. Electronically Signed   By: Odessa Fleming M.D.   On: 07/28/2020 07:32    Assessment/Plan Active Problems:   Acute on chronic respiratory failure with hypoxia (HCC)   Severe sepsis (HCC)   Obstructive sleep apnea   History of alcohol abuse   Acute renal failure due to tubular necrosis (HCC)   1. Acute on chronic respiratory failure with hypoxia plan will be to continue with full support on the ventilator patient's oxygen requirements are quite high right now with 70% FiO2.  Patient is not ready for weaning at this time. 2. Severe sepsis treated in resolution we will continue to  follow 3. Obstructive sleep apnea no change 4. History of alcohol abuse supportive care 5. Acute renal failure following lab   I have personally seen and evaluated the patient, evaluated laboratory and imaging results, formulated the assessment and plan and placed orders. The Patient requires high complexity decision making with multiple systems involvement.  Rounds were done with the Respiratory Therapy Director and Staff therapists and discussed with nursing staff also.  Yevonne Pax, MD [facility] Pulmonary Critical Care Medicine Sleep Medicine

## 2020-07-29 DIAGNOSIS — J9621 Acute and chronic respiratory failure with hypoxia: Secondary | ICD-10-CM | POA: Diagnosis not present

## 2020-07-29 DIAGNOSIS — N170 Acute kidney failure with tubular necrosis: Secondary | ICD-10-CM | POA: Diagnosis not present

## 2020-07-29 DIAGNOSIS — F1011 Alcohol abuse, in remission: Secondary | ICD-10-CM | POA: Diagnosis not present

## 2020-07-29 DIAGNOSIS — G4733 Obstructive sleep apnea (adult) (pediatric): Secondary | ICD-10-CM | POA: Diagnosis not present

## 2020-07-29 LAB — BASIC METABOLIC PANEL
Anion gap: 9 (ref 5–15)
BUN: 37 mg/dL — ABNORMAL HIGH (ref 6–20)
CO2: 29 mmol/L (ref 22–32)
Calcium: 10.1 mg/dL (ref 8.9–10.3)
Chloride: 109 mmol/L (ref 98–111)
Creatinine, Ser: 0.83 mg/dL (ref 0.61–1.24)
GFR, Estimated: >60 mL/min (ref >60)
Glucose, Bld: 165 mg/dL — ABNORMAL HIGH (ref 70–99)
Potassium: 3.7 mmol/L (ref 3.5–5.1)
Sodium: 147 mmol/L — ABNORMAL HIGH (ref 135–145)

## 2020-07-29 LAB — MAGNESIUM: Magnesium: 1.9 mg/dL (ref 1.7–2.4)

## 2020-07-29 LAB — CBC
HCT: 31.9 % — ABNORMAL LOW (ref 39.0–52.0)
Hemoglobin: 9.4 g/dL — ABNORMAL LOW (ref 13.0–17.0)
MCH: 28.3 pg (ref 26.0–34.0)
MCHC: 29.5 g/dL — ABNORMAL LOW (ref 30.0–36.0)
MCV: 96.1 fL (ref 80.0–100.0)
Platelets: 484 10*3/uL — ABNORMAL HIGH (ref 150–400)
RBC: 3.32 MIL/uL — ABNORMAL LOW (ref 4.22–5.81)
RDW: 17.1 % — ABNORMAL HIGH (ref 11.5–15.5)
WBC: 9.3 10*3/uL (ref 4.0–10.5)
nRBC: 0 % (ref 0.0–0.2)

## 2020-07-29 LAB — PHOSPHORUS: Phosphorus: 5.3 mg/dL — ABNORMAL HIGH (ref 2.5–4.6)

## 2020-07-29 NOTE — Progress Notes (Signed)
Pulmonary Critical Care Medicine [facility] [place]   PULMONARY CRITICAL CARE SERVICE  PROGRESS NOTE     Alex Rojas  YBO:[id number]  DOB: Oct 03, 1970   DOA: 07/14/2020  Referring Physician: Carron Curie, MD  HPI: Alex Rojas is a 50 y.o. male seen for follow up of Acute on Chronic Respiratory Failure.  Patient is resting comfortably right now without distress at this time has been on T collar requires 50% FiO2  Medications: Reviewed on Rounds  Physical Exam:  Vitals: Temperature is 98.0 pulse 89 respiratory rate is 18 blood pressure is 138/76 saturations are 95%  Ventilator Settings off the ventilator on T collar FiO2 50%  . General: Comfortable at this time . Eyes: Grossly normal lids, irises & conjunctiva . ENT: grossly tongue is normal . Neck: no obvious mass . Cardiovascular: S1 S2 normal no gallop . Respiratory: No rhonchi no rales are noted at this time . Abdomen: soft . Skin: no rash seen on limited exam . Musculoskeletal: not rigid . Psychiatric:unable to assess . Neurologic: no seizure no involuntary movements         Lab Data:   Basic Metabolic Panel: Recent Labs  Lab 07/24/20 0512 07/26/20 0421 07/27/20 0453 07/27/20 1014 07/28/20 0654 07/29/20 0503  NA 140 145 145  --  148* 147*  K 4.9 4.4 5.5* 3.9 3.6 3.7  CL 106 107 107  --  109 109  CO2 26 30 30   --  30 29  GLUCOSE 244* 147* 133*  --  182* 165*  BUN 49* 45* 42*  --  34* 37*  CREATININE 0.88 0.87 0.78  --  0.72 0.83  CALCIUM 9.6 10.1 10.0  --  9.8 10.1  MG 2.1 1.9  --   --  1.9 1.9  PHOS  --   --   --   --   --  5.3*    ABG: Recent Labs  Lab 07/23/20 2130 07/24/20 0736 07/25/20 1030 07/27/20 0735 07/28/20 0528  PHART 7.381 7.347* 7.365 7.353 7.407  PCO2ART 47.4 53.0* 51.8* 55.0* 50.2*  PO2ART 62.2* 69.2* 85.7 80.0* 55.3*  HCO3 27.3 27.9 28.8* 30.0* 31.2*  O2SAT 89.5 91.0 96.2 95.4 88.2    Liver Function Tests: Recent Labs  Lab 07/24/20 0512   AST 21  ALT 21  ALKPHOS 71  BILITOT 0.4  PROT 7.0  ALBUMIN 2.3*   No results for input(s): LIPASE, AMYLASE in the last 168 hours. No results for input(s): AMMONIA in the last 168 hours.  CBC: Recent Labs  Lab 07/24/20 0512 07/26/20 0421 07/28/20 0654 07/29/20 0503  WBC 14.6* 12.0* 10.4 9.3  HGB 9.5* 9.0* 9.3* 9.4*  HCT 31.9* 30.8* 31.9* 31.9*  MCV 96.4 97.8 97.6 96.1  PLT 398 442* 490* 484*    Cardiac Enzymes: No results for input(s): CKTOTAL, CKMB, CKMBINDEX, TROPONINI in the last 168 hours.  BNP (last 3 results) No results for input(s): BNP in the last 8760 hours.  ProBNP (last 3 results) No results for input(s): PROBNP in the last 8760 hours.  Radiological Exams: DG Chest Port 1 View  Result Date: 07/28/2020 CLINICAL DATA:  50 year old male with sepsis. EXAM: PORTABLE CHEST 1 VIEW COMPARISON:  CT Chest, Abdomen, and Pelvis 07/24/2020 and earlier. FINDINGS: Portable AP semi upright view at 0632 hours. Stable tracheostomy and PICC line. Normal cardiac size and mediastinal contours. Mildly improved lung volumes with ventilation not significantly changed from 07/20/2020. Dense lower lobe collapse or consolidation greater on the left. Small  pleural effusions better demonstrated by CT. No pneumothorax or pulmonary edema. No acute osseous abnormality identified. IMPRESSION: No significant change since 07/20/2020. Dense lower lobe collapse or consolidation greater on the left. Electronically Signed   By: Odessa Fleming M.D.   On: 07/28/2020 07:32    Assessment/Plan Active Problems:   Acute on chronic respiratory failure with hypoxia (HCC)   Severe sepsis (HCC)   Obstructive sleep apnea   History of alcohol abuse   Acute renal failure due to tubular necrosis (HCC)   1. Acute on chronic respiratory failure hypoxia we will continue with the T collar trials titrate oxygen as tolerated continue pulmonary toilet. 2. Severe sepsis treated resolving 3. Obstructive sleep apnea no  change we will continue to follow 4. History of alcohol abuse patient is at baseline 5. Acute renal failure tubular necrosis we will follow the patient's labs closely.   I have personally seen and evaluated the patient, evaluated laboratory and imaging results, formulated the assessment and plan and placed orders. The Patient requires high complexity decision making with multiple systems involvement.  Rounds were done with the Respiratory Therapy Director and Staff therapists and discussed with nursing staff also.  Yevonne Pax, MD [facility] Pulmonary Critical Care Medicine Sleep Medicine

## 2020-07-30 ENCOUNTER — Other Ambulatory Visit (HOSPITAL_COMMUNITY): Payer: No Typology Code available for payment source

## 2020-07-30 DIAGNOSIS — F1011 Alcohol abuse, in remission: Secondary | ICD-10-CM | POA: Diagnosis not present

## 2020-07-30 DIAGNOSIS — G4733 Obstructive sleep apnea (adult) (pediatric): Secondary | ICD-10-CM | POA: Diagnosis not present

## 2020-07-30 DIAGNOSIS — J9621 Acute and chronic respiratory failure with hypoxia: Secondary | ICD-10-CM | POA: Diagnosis not present

## 2020-07-30 DIAGNOSIS — N170 Acute kidney failure with tubular necrosis: Secondary | ICD-10-CM | POA: Diagnosis not present

## 2020-07-30 LAB — BASIC METABOLIC PANEL
Anion gap: 12 (ref 5–15)
BUN: 32 mg/dL — ABNORMAL HIGH (ref 6–20)
CO2: 27 mmol/L (ref 22–32)
Calcium: 9.8 mg/dL (ref 8.9–10.3)
Chloride: 101 mmol/L (ref 98–111)
Creatinine, Ser: 0.68 mg/dL (ref 0.61–1.24)
GFR, Estimated: >60 mL/min (ref >60)
Glucose, Bld: 161 mg/dL — ABNORMAL HIGH (ref 70–99)
Potassium: 3.7 mmol/L (ref 3.5–5.1)
Sodium: 140 mmol/L (ref 135–145)

## 2020-07-30 LAB — TRIGLYCERIDES: Triglycerides: 750 mg/dL — ABNORMAL HIGH (ref <150)

## 2020-07-30 LAB — PHOSPHORUS: Phosphorus: 4.2 mg/dL (ref 2.5–4.6)

## 2020-07-30 LAB — MAGNESIUM: Magnesium: 1.9 mg/dL (ref 1.7–2.4)

## 2020-07-30 NOTE — Progress Notes (Signed)
Pulmonary Critical Care Medicine [facility] [place]   PULMONARY CRITICAL CARE SERVICE  PROGRESS NOTE     Alex Rojas  WER:[id number]  DOB: [birth year]   DOA: 07/14/2020  Referring Physician: Carron Curie, MD  HPI: Alex Rojas is a 50 y.o. male seen for follow up of Acute on Chronic Respiratory Failure.  Patient is currently on assist control mode has been on 50% FiO2 today's goal is 12 hours of T collar  Medications: Reviewed on Rounds  Physical Exam:  Vitals: Temperature is 97.2 pulse 110 respiratory rate 16 blood pressure 126/72 saturations 94%  Ventilator Settings on assist control FiO2 is 50% PEEP 5  . General: Comfortable at this time . Eyes: Grossly normal lids, irises & conjunctiva . ENT: grossly tongue is normal . Neck: no obvious mass . Cardiovascular: S1 S2 normal no gallop . Respiratory: Scattered rhonchi expansion is . Abdomen: soft . Skin: no rash seen on limited exam . Musculoskeletal: not rigid . Psychiatric:unable to assess . Neurologic: no seizure no involuntary movements         Lab Data:   Basic Metabolic Panel: Recent Labs  Lab 07/24/20 0512 07/26/20 0421 07/27/20 0453 07/27/20 1014 07/28/20 0654 07/29/20 0503 07/30/20 0604  NA 140 145 145  --  148* 147* 140  K 4.9 4.4 5.5* 3.9 3.6 3.7 3.7  CL 106 107 107  --  109 109 101  CO2 26 30 30   --  30 29 27   GLUCOSE 244* 147* 133*  --  182* 165* 161*  BUN 49* 45* 42*  --  34* 37* 32*  CREATININE 0.88 0.87 0.78  --  0.72 0.83 0.68  CALCIUM 9.6 10.1 10.0  --  9.8 10.1 9.8  MG 2.1 1.9  --   --  1.9 1.9 1.9  PHOS  --   --   --   --   --  5.3* 4.2    ABG: Recent Labs  Lab 07/23/20 2130 07/24/20 0736 07/25/20 1030 07/27/20 0735 07/28/20 0528  PHART 7.381 7.347* 7.365 7.353 7.407  PCO2ART 47.4 53.0* 51.8* 55.0* 50.2*  PO2ART 62.2* 69.2* 85.7 80.0* 55.3*  HCO3 27.3 27.9 28.8* 30.0* 31.2*  O2SAT 89.5 91.0 96.2 95.4 88.2    Liver Function Tests: Recent Labs   Lab 07/24/20 0512  AST 21  ALT 21  ALKPHOS 71  BILITOT 0.4  PROT 7.0  ALBUMIN 2.3*   No results for input(s): LIPASE, AMYLASE in the last 168 hours. No results for input(s): AMMONIA in the last 168 hours.  CBC: Recent Labs  Lab 07/24/20 0512 07/26/20 0421 07/28/20 0654 07/29/20 0503  WBC 14.6* 12.0* 10.4 9.3  HGB 9.5* 9.0* 9.3* 9.4*  HCT 31.9* 30.8* 31.9* 31.9*  MCV 96.4 97.8 97.6 96.1  PLT 398 442* 490* 484*    Cardiac Enzymes: No results for input(s): CKTOTAL, CKMB, CKMBINDEX, TROPONINI in the last 168 hours.  BNP (last 3 results) No results for input(s): BNP in the last 8760 hours.  ProBNP (last 3 results) No results for input(s): PROBNP in the last 8760 hours.  Radiological Exams: DG CHEST PORT 1 VIEW  Result Date: 07/30/2020 CLINICAL DATA:  Pneumonia.  Tracheostomy. EXAM: PORTABLE CHEST 1 VIEW COMPARISON:  Chest x-ray 07/28/2020.  Chest CT 07/24/2020. FINDINGS: Tracheostomy tube and right PICC line stable position. Heart size stable. Persistent dense atelectasis left lung base. Persistent mild atelectasis right lung base. Persistent bilateral pleural effusions, left side greater than right. No pneumothorax. IMPRESSION: 1.  Tracheostomy  tube and right PICC line stable position. 2. Persistent dense atelectasis left lung base. Persistent mild atelectasis right lung base. Persistent bilateral pleural effusions, left side greater than right. Electronically Signed   By: Maisie Fus  Register   On: 07/30/2020 07:03    Assessment/Plan Active Problems:   Acute on chronic respiratory failure with hypoxia (HCC)   Severe sepsis (HCC)   Obstructive sleep apnea   History of alcohol abuse   Acute renal failure due to tubular necrosis (HCC)   1. Acute on chronic respiratory failure hypoxia patient will wean on T collar for 12-hour goal today 2. Severe sepsis resolved hemodynamics are stable 3. Obstructive sleep apnea no change we will monitor 4. Alcohol abuse no  change 5. Acute renal failure patient is at baseline right now   I have personally seen and evaluated the patient, evaluated laboratory and imaging results, formulated the assessment and plan and placed orders. The Patient requires high complexity decision making with multiple systems involvement.  Rounds were done with the Respiratory Therapy Director and Staff therapists and discussed with nursing staff also.  Yevonne Pax, MD [facility] Pulmonary Critical Care Medicine Sleep Medicine

## 2020-07-31 ENCOUNTER — Other Ambulatory Visit (HOSPITAL_COMMUNITY): Payer: No Typology Code available for payment source

## 2020-07-31 DIAGNOSIS — F1011 Alcohol abuse, in remission: Secondary | ICD-10-CM | POA: Diagnosis not present

## 2020-07-31 DIAGNOSIS — J9621 Acute and chronic respiratory failure with hypoxia: Secondary | ICD-10-CM | POA: Diagnosis not present

## 2020-07-31 DIAGNOSIS — G4733 Obstructive sleep apnea (adult) (pediatric): Secondary | ICD-10-CM | POA: Diagnosis not present

## 2020-07-31 DIAGNOSIS — N170 Acute kidney failure with tubular necrosis: Secondary | ICD-10-CM | POA: Diagnosis not present

## 2020-07-31 NOTE — Progress Notes (Signed)
Pulmonary Critical Care Medicine [facility] [place]   PULMONARY CRITICAL CARE SERVICE  PROGRESS NOTE     Alex Rojas  HOZ:[id number]  DOB: 1970-04-24   DOA: 07/14/2020  Referring Physician: Carron Curie, MD  HPI: Alex Rojas is a 50 y.o. male seen for follow up of Acute on Chronic Respiratory Failure.  Patient is on the ventilator right now full support requiring 60% FiO2 apparently had some desaturations noted overnight  Medications: Reviewed on Rounds  Physical Exam:  Vitals: Temperature is 98.8 pulse 92 respiratory 19 blood pressure is 130/72 saturations 90%  Ventilator Settings right now is on assist control mode on 60% FiO2 with tidal volume 500 PEEP 6  . General: Comfortable at this time . Eyes: Grossly normal lids, irises & conjunctiva . ENT: grossly tongue is normal . Neck: no obvious mass . Cardiovascular: S1 S2 normal no gallop . Respiratory: Scattered rhonchi expansion is equal . Abdomen: soft . Skin: no rash seen on limited exam . Musculoskeletal: not rigid . Psychiatric:unable to assess . Neurologic: no seizure no involuntary movements         Lab Data:   Basic Metabolic Panel: Recent Labs  Lab 07/26/20 0421 07/27/20 0453 07/27/20 1014 07/28/20 0654 07/29/20 0503 07/30/20 0604  NA 145 145  --  148* 147* 140  K 4.4 5.5* 3.9 3.6 3.7 3.7  CL 107 107  --  109 109 101  CO2 30 30  --  30 29 27   GLUCOSE 147* 133*  --  182* 165* 161*  BUN 45* 42*  --  34* 37* 32*  CREATININE 0.87 0.78  --  0.72 0.83 0.68  CALCIUM 10.1 10.0  --  9.8 10.1 9.8  MG 1.9  --   --  1.9 1.9 1.9  PHOS  --   --   --   --  5.3* 4.2    ABG: Recent Labs  Lab 07/25/20 1030 07/27/20 0735 07/28/20 0528  PHART 7.365 7.353 7.407  PCO2ART 51.8* 55.0* 50.2*  PO2ART 85.7 80.0* 55.3*  HCO3 28.8* 30.0* 31.2*  O2SAT 96.2 95.4 88.2    Liver Function Tests: No results for input(s): AST, ALT, ALKPHOS, BILITOT, PROT, ALBUMIN in the last 168  hours. No results for input(s): LIPASE, AMYLASE in the last 168 hours. No results for input(s): AMMONIA in the last 168 hours.  CBC: Recent Labs  Lab 07/26/20 0421 07/28/20 0654 07/29/20 0503  WBC 12.0* 10.4 9.3  HGB 9.0* 9.3* 9.4*  HCT 30.8* 31.9* 31.9*  MCV 97.8 97.6 96.1  PLT 442* 490* 484*    Cardiac Enzymes: No results for input(s): CKTOTAL, CKMB, CKMBINDEX, TROPONINI in the last 168 hours.  BNP (last 3 results) No results for input(s): BNP in the last 8760 hours.  ProBNP (last 3 results) No results for input(s): PROBNP in the last 8760 hours.  Radiological Exams: DG CHEST PORT 1 VIEW  Result Date: 07/30/2020 CLINICAL DATA:  Pneumonia.  Tracheostomy. EXAM: PORTABLE CHEST 1 VIEW COMPARISON:  Chest x-ray 07/28/2020.  Chest CT 07/24/2020. FINDINGS: Tracheostomy tube and right PICC line stable position. Heart size stable. Persistent dense atelectasis left lung base. Persistent mild atelectasis right lung base. Persistent bilateral pleural effusions, left side greater than right. No pneumothorax. IMPRESSION: 1.  Tracheostomy tube and right PICC line stable position. 2. Persistent dense atelectasis left lung base. Persistent mild atelectasis right lung base. Persistent bilateral pleural effusions, left side greater than right. Electronically Signed   By: 07/26/2020  Register  On: 07/30/2020 07:03    Assessment/Plan Active Problems:   Acute on chronic respiratory failure with hypoxia (HCC)   Severe sepsis (HCC)   Obstructive sleep apnea   History of alcohol abuse   Acute renal failure due to tubular necrosis (HCC)   1. Acute on chronic respiratory failure with hypoxia plan is going to be to continue with full support right now patient had issues with desaturations we will follow-up closely chest x-ray that was done showed some atelectasis bilaterally which may be contributing to some hypoxia 2. Severe sepsis treated slow improvement we will continue to monitor 3. Obstructive  sleep apnea nonissue 4. Alcohol abuse supportive care 5. Acute renal failure following patient's lab work close   I have personally seen and evaluated the patient, evaluated laboratory and imaging results, formulated the assessment and plan and placed orders. The Patient requires high complexity decision making with multiple systems involvement.  Rounds were done with the Respiratory Therapy Director and Staff therapists and discussed with nursing staff also.  Yevonne Pax, MD [facility] Pulmonary Critical Care Medicine Sleep Medicine

## 2020-08-01 ENCOUNTER — Other Ambulatory Visit (HOSPITAL_COMMUNITY): Payer: No Typology Code available for payment source

## 2020-08-01 DIAGNOSIS — F1011 Alcohol abuse, in remission: Secondary | ICD-10-CM | POA: Diagnosis not present

## 2020-08-01 DIAGNOSIS — J9621 Acute and chronic respiratory failure with hypoxia: Secondary | ICD-10-CM | POA: Diagnosis not present

## 2020-08-01 DIAGNOSIS — N170 Acute kidney failure with tubular necrosis: Secondary | ICD-10-CM | POA: Diagnosis not present

## 2020-08-01 DIAGNOSIS — G4733 Obstructive sleep apnea (adult) (pediatric): Secondary | ICD-10-CM | POA: Diagnosis not present

## 2020-08-01 LAB — BLOOD GAS, ARTERIAL
Acid-Base Excess: 6.9 mmol/L — ABNORMAL HIGH (ref 0.0–2.0)
Acid-Base Excess: 6.7 mmol/L — ABNORMAL HIGH (ref 0.0–2.0)
Bicarbonate: 31.8 mmol/L — ABNORMAL HIGH (ref 20.0–28.0)
Bicarbonate: 31.3 mmol/L — ABNORMAL HIGH (ref 20.0–28.0)
Drawn by: 164
Drawn by: 164
FIO2: 70
FIO2: 70
O2 Saturation: 95.2 %
O2 Saturation: 95.4 %
Patient temperature: 37
Patient temperature: 37
pCO2 arterial: 53.4 mmHg — ABNORMAL HIGH (ref 32.0–48.0)
pCO2 arterial: 50.2 mmHg — ABNORMAL HIGH (ref 32.0–48.0)
pH, Arterial: 7.392 (ref 7.350–7.450)
pH, Arterial: 7.411 (ref 7.350–7.450)
pO2, Arterial: 78.3 mmHg — ABNORMAL LOW (ref 83.0–108.0)
pO2, Arterial: 78.7 mmHg — ABNORMAL LOW (ref 83.0–108.0)

## 2020-08-01 LAB — BASIC METABOLIC PANEL
Anion gap: 10 (ref 5–15)
BUN: 32 mg/dL — ABNORMAL HIGH (ref 6–20)
CO2: 29 mmol/L (ref 22–32)
Calcium: 9.7 mg/dL (ref 8.9–10.3)
Chloride: 101 mmol/L (ref 98–111)
Creatinine, Ser: 0.76 mg/dL (ref 0.61–1.24)
GFR, Estimated: >60 mL/min (ref >60)
Glucose, Bld: 156 mg/dL — ABNORMAL HIGH (ref 70–99)
Potassium: 4.5 mmol/L (ref 3.5–5.1)
Sodium: 140 mmol/L (ref 135–145)

## 2020-08-01 LAB — CBC
HCT: 31 % — ABNORMAL LOW (ref 39.0–52.0)
Hemoglobin: 9.2 g/dL — ABNORMAL LOW (ref 13.0–17.0)
MCH: 28.7 pg (ref 26.0–34.0)
MCHC: 29.7 g/dL — ABNORMAL LOW (ref 30.0–36.0)
MCV: 96.6 fL (ref 80.0–100.0)
Platelets: 465 10*3/uL — ABNORMAL HIGH (ref 150–400)
RBC: 3.21 MIL/uL — ABNORMAL LOW (ref 4.22–5.81)
RDW: 17.9 % — ABNORMAL HIGH (ref 11.5–15.5)
WBC: 8.7 10*3/uL (ref 4.0–10.5)
nRBC: 0.2 % (ref 0.0–0.2)

## 2020-08-01 LAB — MAGNESIUM: Magnesium: 1.9 mg/dL (ref 1.7–2.4)

## 2020-08-01 LAB — PHOSPHORUS: Phosphorus: 5.3 mg/dL — ABNORMAL HIGH (ref 2.5–4.6)

## 2020-08-01 NOTE — Progress Notes (Signed)
Pulmonary Critical Care Medicine [facility] [place]   PULMONARY CRITICAL CARE SERVICE  PROGRESS NOTE     Alex Rojas  YQI:[id number]  DOB: 01/29/1971   DOA: 07/14/2020  Referring Physician: Carron Curie, MD  HPI: Alex Rojas is a 50 y.o. male seen for follow up of Acute on Chronic Respiratory Failure.  Patient is on assist control mode right now requiring 70% FiO2 which has been decreased from 100% earlier.  Medications: Reviewed on Rounds  Physical Exam:  Vitals: Temperature 100 pulse 76 respiratory 18 blood pressure is 134/76 saturations 97%  Ventilator Settings on assist control FiO2 70% tidal volume 500 PEEP 6  . General: Comfortable at this time . Eyes: Grossly normal lids, irises & conjunctiva . ENT: grossly tongue is normal . Neck: no obvious mass . Cardiovascular: S1 S2 normal no gallop . Respiratory: No rhonchi very coarse breath sound . Abdomen: soft . Skin: no rash seen on limited exam . Musculoskeletal: not rigid . Psychiatric:unable to assess . Neurologic: no seizure no involuntary movements         Lab Data:   Basic Metabolic Panel: Recent Labs  Lab 07/26/20 0421 07/27/20 0453 07/27/20 1014 07/28/20 0654 07/29/20 0503 07/30/20 0604 08/01/20 0605  NA 145 145  --  148* 147* 140 140  K 4.4 5.5* 3.9 3.6 3.7 3.7 4.5  CL 107 107  --  109 109 101 101  CO2 30 30  --  30 29 27 29   GLUCOSE 147* 133*  --  182* 165* 161* 156*  BUN 45* 42*  --  34* 37* 32* 32*  CREATININE 0.87 0.78  --  0.72 0.83 0.68 0.76  CALCIUM 10.1 10.0  --  9.8 10.1 9.8 9.7  MG 1.9  --   --  1.9 1.9 1.9 1.9  PHOS  --   --   --   --  5.3* 4.2 5.3*    ABG: Recent Labs  Lab 07/25/20 1030 07/27/20 0735 07/28/20 0528  PHART 7.365 7.353 7.407  PCO2ART 51.8* 55.0* 50.2*  PO2ART 85.7 80.0* 55.3*  HCO3 28.8* 30.0* 31.2*  O2SAT 96.2 95.4 88.2    Liver Function Tests: No results for input(s): AST, ALT, ALKPHOS, BILITOT, PROT, ALBUMIN in the last  168 hours. No results for input(s): LIPASE, AMYLASE in the last 168 hours. No results for input(s): AMMONIA in the last 168 hours.  CBC: Recent Labs  Lab 07/26/20 0421 07/28/20 0654 07/29/20 0503 08/01/20 0605  WBC 12.0* 10.4 9.3 8.7  HGB 9.0* 9.3* 9.4* 9.2*  HCT 30.8* 31.9* 31.9* 31.0*  MCV 97.8 97.6 96.1 96.6  PLT 442* 490* 484* 465*    Cardiac Enzymes: No results for input(s): CKTOTAL, CKMB, CKMBINDEX, TROPONINI in the last 168 hours.  BNP (last 3 results) No results for input(s): BNP in the last 8760 hours.  ProBNP (last 3 results) No results for input(s): PROBNP in the last 8760 hours.  Radiological Exams: DG CHEST PORT 1 VIEW  Result Date: 08/01/2020 CLINICAL DATA:  Shortness of breath EXAM: PORTABLE CHEST 1 VIEW COMPARISON:  July 30, 2020 FINDINGS: Tracheostomy catheter tip is 6.7 cm above the carina. Central catheter tip is in the superior vena cava. No pneumothorax. There are small pleural effusions bilaterally with bibasilar atelectasis. Heart is mildly enlarged with pulmonary vascularity normal. No adenopathy appreciable. No bone lesions. IMPRESSION: Tube and catheter positions as described without pneumothorax. Small pleural effusions bilaterally with bibasilar atelectasis. Stable cardiac silhouette. Electronically Signed   By: August 01, 2020  Margarita Grizzle III M.D.   On: 08/01/2020 08:13    Assessment/Plan Active Problems:   Acute on chronic respiratory failure with hypoxia (HCC)   Severe sepsis (HCC)   Obstructive sleep apnea   History of alcohol abuse   Acute renal failure due to tubular necrosis (HCC)   1. Acute on chronic respiratory failure with hypoxia continue with full support on the ventilator patient does have low-grade fever which will be worked up. 2. Sepsis once again has low-grade fever will need to be worked up. 3. Obstructive sleep apnea nonissue at this time. 4. History of alcohol abuse no change 5. Acute renal failure with tubular necrosis following  labs   I have personally seen and evaluated the patient, evaluated laboratory and imaging results, formulated the assessment and plan and placed orders. The Patient requires high complexity decision making with multiple systems involvement.  Rounds were done with the Respiratory Therapy Director and Staff therapists and discussed with nursing staff also.  Yevonne Pax, MD [facility] Pulmonary Critical Care Medicine Sleep Medicine

## 2020-08-02 LAB — BASIC METABOLIC PANEL
Anion gap: 10 (ref 5–15)
BUN: 42 mg/dL — ABNORMAL HIGH (ref 6–20)
CO2: 30 mmol/L (ref 22–32)
Calcium: 10 mg/dL (ref 8.9–10.3)
Chloride: 101 mmol/L (ref 98–111)
Creatinine, Ser: 0.85 mg/dL (ref 0.61–1.24)
GFR, Estimated: >60 mL/min (ref >60)
Glucose, Bld: 145 mg/dL — ABNORMAL HIGH (ref 70–99)
Potassium: 5.1 mmol/L (ref 3.5–5.1)
Sodium: 141 mmol/L (ref 135–145)

## 2020-08-02 LAB — URINALYSIS, ROUTINE W REFLEX MICROSCOPIC
Bilirubin Urine: NEGATIVE
Glucose, UA: NEGATIVE mg/dL
Ketones, ur: NEGATIVE mg/dL
Nitrite: NEGATIVE
Protein, ur: NEGATIVE mg/dL
RBC / HPF: >50 RBC/hpf — ABNORMAL HIGH (ref 0–5)
Specific Gravity, Urine: 1.023 (ref 1.005–1.030)
pH: 5 (ref 5.0–8.0)

## 2020-08-02 LAB — LIPASE, BLOOD: Lipase: 32 U/L (ref 11–51)

## 2020-08-02 LAB — MAGNESIUM: Magnesium: 2 mg/dL (ref 1.7–2.4)

## 2020-08-02 LAB — AMYLASE: Amylase: 14 U/L — ABNORMAL LOW (ref 28–100)

## 2020-08-03 LAB — CBC
HCT: 33.1 % — ABNORMAL LOW (ref 39.0–52.0)
Hemoglobin: 10 g/dL — ABNORMAL LOW (ref 13.0–17.0)
MCH: 28.9 pg (ref 26.0–34.0)
MCHC: 30.2 g/dL (ref 30.0–36.0)
MCV: 95.7 fL (ref 80.0–100.0)
Platelets: 419 10*3/uL — ABNORMAL HIGH (ref 150–400)
RBC: 3.46 MIL/uL — ABNORMAL LOW (ref 4.22–5.81)
RDW: 18.4 % — ABNORMAL HIGH (ref 11.5–15.5)
WBC: 10.2 10*3/uL (ref 4.0–10.5)
nRBC: 0 % (ref 0.0–0.2)

## 2020-08-03 LAB — URINE CULTURE: Culture: NO GROWTH

## 2020-08-03 LAB — PHOSPHORUS: Phosphorus: 6.2 mg/dL — ABNORMAL HIGH (ref 2.5–4.6)

## 2020-08-03 LAB — RESP PANEL BY RT-PCR (FLU A&B, COVID) ARPGX2
Influenza A by PCR: NEGATIVE
Influenza B by PCR: NEGATIVE
SARS Coronavirus 2 by RT PCR: NEGATIVE

## 2020-08-03 LAB — BASIC METABOLIC PANEL
Anion gap: 10 (ref 5–15)
BUN: 47 mg/dL — ABNORMAL HIGH (ref 6–20)
CO2: 31 mmol/L (ref 22–32)
Calcium: 10.3 mg/dL (ref 8.9–10.3)
Chloride: 101 mmol/L (ref 98–111)
Creatinine, Ser: 0.87 mg/dL (ref 0.61–1.24)
GFR, Estimated: >60 mL/min (ref >60)
Glucose, Bld: 155 mg/dL — ABNORMAL HIGH (ref 70–99)
Potassium: 5 mmol/L (ref 3.5–5.1)
Sodium: 142 mmol/L (ref 135–145)

## 2020-08-03 LAB — C-REACTIVE PROTEIN: CRP: 5.4 mg/dL — ABNORMAL HIGH (ref <1.0)

## 2020-08-03 LAB — MAGNESIUM: Magnesium: 2 mg/dL (ref 1.7–2.4)

## 2020-08-03 LAB — SEDIMENTATION RATE: Sed Rate: 127 mm/hr — ABNORMAL HIGH (ref 0–16)

## 2020-08-03 LAB — PROCALCITONIN: Procalcitonin: <0.1 ng/mL

## 2020-08-04 ENCOUNTER — Other Ambulatory Visit (HOSPITAL_COMMUNITY): Payer: No Typology Code available for payment source

## 2020-08-04 LAB — CULTURE, RESPIRATORY W GRAM STAIN: Culture: NORMAL

## 2020-08-05 LAB — BASIC METABOLIC PANEL
Anion gap: 10 (ref 5–15)
BUN: 54 mg/dL — ABNORMAL HIGH (ref 6–20)
CO2: 31 mmol/L (ref 22–32)
Calcium: 9.6 mg/dL (ref 8.9–10.3)
Chloride: 103 mmol/L (ref 98–111)
Creatinine, Ser: 1.09 mg/dL (ref 0.61–1.24)
GFR, Estimated: >60 mL/min (ref >60)
Glucose, Bld: 134 mg/dL — ABNORMAL HIGH (ref 70–99)
Potassium: 4.9 mmol/L (ref 3.5–5.1)
Sodium: 144 mmol/L (ref 135–145)

## 2020-08-05 LAB — CBC WITH DIFFERENTIAL/PLATELET
Abs Immature Granulocytes: 0.09 10*3/uL — ABNORMAL HIGH (ref 0.00–0.07)
Basophils Absolute: 0 10*3/uL (ref 0.0–0.1)
Basophils Relative: 1 %
Eosinophils Absolute: 0.3 10*3/uL (ref 0.0–0.5)
Eosinophils Relative: 3 %
HCT: 32.8 % — ABNORMAL LOW (ref 39.0–52.0)
Hemoglobin: 9.7 g/dL — ABNORMAL LOW (ref 13.0–17.0)
Immature Granulocytes: 1 %
Lymphocytes Relative: 21 %
Lymphs Abs: 1.8 10*3/uL (ref 0.7–4.0)
MCH: 28.8 pg (ref 26.0–34.0)
MCHC: 29.6 g/dL — ABNORMAL LOW (ref 30.0–36.0)
MCV: 97.3 fL (ref 80.0–100.0)
Monocytes Absolute: 1 10*3/uL (ref 0.1–1.0)
Monocytes Relative: 12 %
Neutro Abs: 5.5 10*3/uL (ref 1.7–7.7)
Neutrophils Relative %: 62 %
Platelets: 349 10*3/uL (ref 150–400)
RBC: 3.37 MIL/uL — ABNORMAL LOW (ref 4.22–5.81)
RDW: 18.2 % — ABNORMAL HIGH (ref 11.5–15.5)
WBC: 8.7 10*3/uL (ref 4.0–10.5)
nRBC: 0 % (ref 0.0–0.2)

## 2020-08-05 LAB — AMMONIA: Ammonia: 29 umol/L (ref 9–35)

## 2020-08-06 DIAGNOSIS — F1011 Alcohol abuse, in remission: Secondary | ICD-10-CM | POA: Diagnosis not present

## 2020-08-06 DIAGNOSIS — G4733 Obstructive sleep apnea (adult) (pediatric): Secondary | ICD-10-CM | POA: Diagnosis not present

## 2020-08-06 DIAGNOSIS — N170 Acute kidney failure with tubular necrosis: Secondary | ICD-10-CM | POA: Diagnosis not present

## 2020-08-06 DIAGNOSIS — J9621 Acute and chronic respiratory failure with hypoxia: Secondary | ICD-10-CM | POA: Diagnosis not present

## 2020-08-06 LAB — URINALYSIS, ROUTINE W REFLEX MICROSCOPIC
Bilirubin Urine: NEGATIVE
Glucose, UA: NEGATIVE mg/dL
Ketones, ur: 5 mg/dL — AB
Leukocytes,Ua: NEGATIVE
Nitrite: NEGATIVE
Protein, ur: 100 mg/dL — AB
RBC / HPF: >50 RBC/hpf — ABNORMAL HIGH (ref 0–5)
Specific Gravity, Urine: 1.02 (ref 1.005–1.030)
pH: 6 (ref 5.0–8.0)

## 2020-08-06 LAB — BLOOD CULTURE ID PANEL (REFLEXED) - BCID2

## 2020-08-06 LAB — VANCOMYCIN, TROUGH: Vancomycin Tr: 16 ug/mL (ref 15–20)

## 2020-08-06 NOTE — Progress Notes (Signed)
Pulmonary Critical Care Medicine [facility] [place]   PULMONARY CRITICAL CARE SERVICE  PROGRESS NOTE     Alex Rojas  DGU:[id number]  DOB: 19-May-1970   DOA: 07/14/2020  Referring Physician: Carron Curie, MD  HPI: Alex Rojas is a 50 y.o. male seen for follow up of Acute on Chronic Respiratory Failure.  Patient is on the ventilator right now on full support has been requiring assist control right now is on 70% FiO2.  Medications: Reviewed on Rounds  Physical Exam:  Vitals: Temperature was 101.5 pulse 112 respiratory rate 18 blood pressure is 113/62 saturations 93%  Ventilator Settings on assist control FiO2 70% tidal volume 500 PEEP of 5  . General: Comfortable at this time . Eyes: Grossly normal lids, irises & conjunctiva . ENT: grossly tongue is normal . Neck: no obvious mass . Cardiovascular: S1 S2 normal no gallop . Respiratory: Scattered coarse rhonchi noted bilaterally . Abdomen: soft . Skin: no rash seen on limited exam . Musculoskeletal: not rigid . Psychiatric:unable to assess . Neurologic: no seizure no involuntary movements         Lab Data:   Basic Metabolic Panel: Recent Labs  Lab 08/01/20 0605 08/02/20 0440 08/03/20 0329 08/05/20 0459  NA 140 141 142 144  K 4.5 5.1 5.0 4.9  CL 101 101 101 103  CO2 29 30 31 31   GLUCOSE 156* 145* 155* 134*  BUN 32* 42* 47* 54*  CREATININE 0.76 0.85 0.87 1.09  CALCIUM 9.7 10.0 10.3 9.6  MG 1.9 2.0 2.0  --   PHOS 5.3*  --  6.2*  --     ABG: Recent Labs  Lab 08/01/20 1615 08/01/20 1808  PHART 7.392 7.411  PCO2ART 53.4* 50.2*  PO2ART 78.3* 78.7*  HCO3 31.8* 31.3*  O2SAT 95.2 95.4    Liver Function Tests: No results for input(s): AST, ALT, ALKPHOS, BILITOT, PROT, ALBUMIN in the last 168 hours. Recent Labs  Lab 08/02/20 0440  LIPASE 32  AMYLASE 14*   Recent Labs  Lab 08/05/20 1157  AMMONIA 29    CBC: Recent Labs  Lab 08/01/20 0605 08/03/20 0329 08/05/20 0459   WBC 8.7 10.2 8.7  NEUTROABS  --   --  5.5  HGB 9.2* 10.0* 9.7*  HCT 31.0* 33.1* 32.8*  MCV 96.6 95.7 97.3  PLT 465* 419* 349    Cardiac Enzymes: No results for input(s): CKTOTAL, CKMB, CKMBINDEX, TROPONINI in the last 168 hours.  BNP (last 3 results) No results for input(s): BNP in the last 8760 hours.  ProBNP (last 3 results) No results for input(s): PROBNP in the last 8760 hours.  Radiological Exams: No results found.  Assessment/Plan Active Problems:   Acute on chronic respiratory failure with hypoxia (HCC)   Severe sepsis (HCC)   Obstructive sleep apnea   History of alcohol abuse   Acute renal failure due to tubular necrosis (HCC)   1. Acute on chronic respiratory failure with hypoxia patient remains on the ventilator full support oxygen requirements are up also patient has a fever and this will need to be worked up further.  I will hold off on weaning obviously 2. Severe sepsis again is febrile being worked up by the primary team. 3. History of alcohol abuse currently not an issue 4. Sleep apnea nonissue 5. Acute renal failure were following the patient's labs and hydration status closely   I have personally seen and evaluated the patient, evaluated laboratory and imaging results, formulated the assessment and plan and  placed orders. The Patient requires high complexity decision making with multiple systems involvement.  Rounds were done with the Respiratory Therapy Director and Staff therapists and discussed with nursing staff also.  Allyne Gee, MD [facility] Pulmonary Critical Care Medicine Sleep Medicine

## 2020-08-07 ENCOUNTER — Other Ambulatory Visit (HOSPITAL_COMMUNITY): Payer: No Typology Code available for payment source

## 2020-08-07 DIAGNOSIS — F1011 Alcohol abuse, in remission: Secondary | ICD-10-CM | POA: Diagnosis not present

## 2020-08-07 DIAGNOSIS — N170 Acute kidney failure with tubular necrosis: Secondary | ICD-10-CM | POA: Diagnosis not present

## 2020-08-07 DIAGNOSIS — J9621 Acute and chronic respiratory failure with hypoxia: Secondary | ICD-10-CM | POA: Diagnosis not present

## 2020-08-07 DIAGNOSIS — G4733 Obstructive sleep apnea (adult) (pediatric): Secondary | ICD-10-CM | POA: Diagnosis not present

## 2020-08-07 LAB — COMPREHENSIVE METABOLIC PANEL
ALT: 12 U/L (ref 0–44)
AST: 25 U/L (ref 15–41)
Albumin: 2.4 g/dL — ABNORMAL LOW (ref 3.5–5.0)
Alkaline Phosphatase: 60 U/L (ref 38–126)
Anion gap: 10 (ref 5–15)
BUN: 53 mg/dL — ABNORMAL HIGH (ref 6–20)
CO2: 29 mmol/L (ref 22–32)
Calcium: 8.7 mg/dL — ABNORMAL LOW (ref 8.9–10.3)
Chloride: 106 mmol/L (ref 98–111)
Creatinine, Ser: 1.4 mg/dL — ABNORMAL HIGH (ref 0.61–1.24)
GFR, Estimated: >60 mL/min (ref >60)
Glucose, Bld: 195 mg/dL — ABNORMAL HIGH (ref 70–99)
Potassium: 5.7 mmol/L — ABNORMAL HIGH (ref 3.5–5.1)
Sodium: 145 mmol/L (ref 135–145)
Total Bilirubin: 0.3 mg/dL (ref 0.3–1.2)
Total Protein: 7.8 g/dL (ref 6.5–8.1)

## 2020-08-07 LAB — URINE CULTURE: Culture: NO GROWTH

## 2020-08-07 LAB — CBC
HCT: 31.5 % — ABNORMAL LOW (ref 39.0–52.0)
Hemoglobin: 9.2 g/dL — ABNORMAL LOW (ref 13.0–17.0)
MCH: 28.8 pg (ref 26.0–34.0)
MCHC: 29.2 g/dL — ABNORMAL LOW (ref 30.0–36.0)
MCV: 98.4 fL (ref 80.0–100.0)
Platelets: 308 10*3/uL (ref 150–400)
RBC: 3.2 MIL/uL — ABNORMAL LOW (ref 4.22–5.81)
RDW: 18.4 % — ABNORMAL HIGH (ref 11.5–15.5)
WBC: 14.6 10*3/uL — ABNORMAL HIGH (ref 4.0–10.5)
nRBC: 0 % (ref 0.0–0.2)

## 2020-08-07 MED ORDER — IOHEXOL 300 MG/ML  SOLN
75.0000 mL | Freq: Once | INTRAMUSCULAR | Status: AC | PRN
  Administered 2020-08-07: 75 mL via INTRAVENOUS

## 2020-08-07 NOTE — Progress Notes (Signed)
Pulmonary Critical Care Medicine [facility] [place]   PULMONARY CRITICAL CARE SERVICE  PROGRESS NOTE     Alex Rojas  YTK:[id number]  DOB: January 07, 1971   DOA: 07/14/2020  Referring Physician: Carron Curie, MD  HPI: Alex Rojas is a 50 y.o. male seen for follow up of Acute on Chronic Respiratory Failure.  Patient is on the ventilator on full support right now still having temperatures temperature was up to 1-1.9  Medications: Reviewed on Rounds  Physical Exam:  Vitals: Temperature one 1.9 pulse 111 respiratory 23 blood pressure is 119/62 saturations 99%  Ventilator Settings currently on assist control FiO2 is 70% tidal volume 500 PEEP 6  . General: Comfortable at this time . Eyes: Grossly normal lids, irises & conjunctiva . ENT: grossly tongue is normal . Neck: no obvious mass . Cardiovascular: S1 S2 normal no gallop . Respiratory: No rhonchi no rales are noted at this time . Abdomen: soft . Skin: no rash seen on limited exam . Musculoskeletal: not rigid . Psychiatric:unable to assess . Neurologic: no seizure no involuntary movements         Lab Data:   Basic Metabolic Panel: Recent Labs  Lab 08/01/20 0605 08/02/20 0440 08/03/20 0329 08/05/20 0459 08/07/20 0345  NA 140 141 142 144 145  K 4.5 5.1 5.0 4.9 5.7*  CL 101 101 101 103 106  CO2 29 30 31 31 29   GLUCOSE 156* 145* 155* 134* 195*  BUN 32* 42* 47* 54* 53*  CREATININE 0.76 0.85 0.87 1.09 1.40*  CALCIUM 9.7 10.0 10.3 9.6 8.7*  MG 1.9 2.0 2.0  --   --   PHOS 5.3*  --  6.2*  --   --     ABG: Recent Labs  Lab 08/01/20 1615 08/01/20 1808  PHART 7.392 7.411  PCO2ART 53.4* 50.2*  PO2ART 78.3* 78.7*  HCO3 31.8* 31.3*  O2SAT 95.2 95.4    Liver Function Tests: Recent Labs  Lab 08/07/20 0345  AST 25  ALT 12  ALKPHOS 60  BILITOT 0.3  PROT 7.8  ALBUMIN 2.4*   Recent Labs  Lab 08/02/20 0440  LIPASE 32  AMYLASE 14*   Recent Labs  Lab 08/05/20 1157  AMMONIA 29     CBC: Recent Labs  Lab 08/01/20 0605 08/03/20 0329 08/05/20 0459 08/07/20 0345  WBC 8.7 10.2 8.7 14.6*  NEUTROABS  --   --  5.5  --   HGB 9.2* 10.0* 9.7* 9.2*  HCT 31.0* 33.1* 32.8* 31.5*  MCV 96.6 95.7 97.3 98.4  PLT 465* 419* 349 308    Cardiac Enzymes: No results for input(s): CKTOTAL, CKMB, CKMBINDEX, TROPONINI in the last 168 hours.  BNP (last 3 results) No results for input(s): BNP in the last 8760 hours.  ProBNP (last 3 results) No results for input(s): PROBNP in the last 8760 hours.  Radiological Exams: DG Chest Port 1 View  Result Date: 08/07/2020 CLINICAL DATA:  Respiratory failure. EXAM: PORTABLE CHEST 1 VIEW COMPARISON:  08/04/2020. FINDINGS: Tracheostomy tube noted good anatomic position. Heart size stable. Bibasilar atelectasis. Bibasilar pulmonary infiltrates/edema again noted. Small left pleural effusion again noted. IMPRESSION: 1.  Tracheostomy tube in stable position. 2. Bibasilar atelectasis. Bibasilar pulmonary infiltrates/edema again noted. Small left pleural effusion again noted. Electronically Signed   By: 10/04/2020  Register   On: 08/07/2020 06:31    Assessment/Plan Active Problems:   Acute on chronic respiratory failure with hypoxia (HCC)   Severe sepsis (HCC)   Obstructive sleep apnea  History of alcohol abuse   Acute renal failure due to tubular necrosis (HCC)   1. Acute on chronic respiratory failure with hypoxia plan is going to be to continue with full support on the ventilator.  Fevers again not resolved yet 2. Severe sepsis still febrile discussed with primary care team 3. OSA nonissue right now 4. History of alcohol abuse patient is at baseline 5. Acute renal failure following the labs   I have personally seen and evaluated the patient, evaluated laboratory and imaging results, formulated the assessment and plan and placed orders. The Patient requires high complexity decision making with multiple systems involvement.  Rounds were done  with the Respiratory Therapy Director and Staff therapists and discussed with nursing staff also.  Yevonne Pax, MD [facility] Pulmonary Critical Care Medicine Sleep Medicine

## 2020-08-07 NOTE — Progress Notes (Signed)
IR.  Received call from CT regarding possible contrast extravasation for patient. Per CT, patient with LUE PIV. CT abdomen/pelvis obtain and little contrast seen on CT (of note, patient had reduced contrast dose due to renal function) and patient with palpable firmness below PIV (of note, this palpable firmness is at site of prior PIV, and per RN at bedside this has been present prior to scan). Current LUE PIV flushes/aspirates without difficulty. Patient laying in bed, tracheostomy in place with sedation, non-responsive.  Do not believe this is a contrast extravasation. No further recommendations at this time.  Please call IR with questions/concerns.   Waylan Boga Aven Cegielski, PA-C 08/07/2020, 3:44 PM

## 2020-08-08 DIAGNOSIS — J9621 Acute and chronic respiratory failure with hypoxia: Secondary | ICD-10-CM | POA: Diagnosis not present

## 2020-08-08 DIAGNOSIS — G4733 Obstructive sleep apnea (adult) (pediatric): Secondary | ICD-10-CM | POA: Diagnosis not present

## 2020-08-08 DIAGNOSIS — F1011 Alcohol abuse, in remission: Secondary | ICD-10-CM | POA: Diagnosis not present

## 2020-08-08 DIAGNOSIS — N170 Acute kidney failure with tubular necrosis: Secondary | ICD-10-CM | POA: Diagnosis not present

## 2020-08-08 LAB — BASIC METABOLIC PANEL
Anion gap: 11 (ref 5–15)
BUN: 51 mg/dL — ABNORMAL HIGH (ref 6–20)
CO2: 27 mmol/L (ref 22–32)
Calcium: 8.6 mg/dL — ABNORMAL LOW (ref 8.9–10.3)
Chloride: 109 mmol/L (ref 98–111)
Creatinine, Ser: 1.32 mg/dL — ABNORMAL HIGH (ref 0.61–1.24)
GFR, Estimated: >60 mL/min (ref >60)
Glucose, Bld: 177 mg/dL — ABNORMAL HIGH (ref 70–99)
Potassium: 4.8 mmol/L (ref 3.5–5.1)
Sodium: 147 mmol/L — ABNORMAL HIGH (ref 135–145)

## 2020-08-08 LAB — CULTURE, BLOOD (ROUTINE X 2)

## 2020-08-08 LAB — VANCOMYCIN, TROUGH: Vancomycin Tr: 32 ug/mL (ref 15–20)

## 2020-08-08 NOTE — Progress Notes (Signed)
Pulmonary Critical Care Medicine [facility] [place]   PULMONARY CRITICAL CARE SERVICE  PROGRESS NOTE     Alex Rojas  WNU:[id number]  DOB: [birth year]   DOA: 07/14/2020  Referring Physician: Carron Curie, MD  HPI: Alex Rojas is a 50 y.o. male seen for follow up of Acute on Chronic Respiratory Failure.  Patient currently is on assist control mode on 60% FiO2 with good volumes  Medications: Reviewed on Rounds  Physical Exam:  Vitals: Temperature 100.9 pulse 99 respiratory rate is 19 blood pressure is 112/58 saturations 95%  Ventilator Settings on assist control FiO2 60% tidal volume 500 PEEP 6  . General: Comfortable at this time . Eyes: Grossly normal lids, irises & conjunctiva . ENT: grossly tongue is normal . Neck: no obvious mass . Cardiovascular: S1 S2 normal no gallop . Respiratory: Scattered rhonchi expansion is . Abdomen: soft . Skin: no rash seen on limited exam . Musculoskeletal: not rigid . Psychiatric:unable to assess . Neurologic: no seizure no involuntary movements         Lab Data:   Basic Metabolic Panel: Recent Labs  Lab 08/02/20 0440 08/03/20 0329 08/05/20 0459 08/07/20 0345 08/08/20 0431  NA 141 142 144 145 147*  K 5.1 5.0 4.9 5.7* 4.8  CL 101 101 103 106 109  CO2 30 31 31 29 27   GLUCOSE 145* 155* 134* 195* 177*  BUN 42* 47* 54* 53* 51*  CREATININE 0.85 0.87 1.09 1.40* 1.32*  CALCIUM 10.0 10.3 9.6 8.7* 8.6*  MG 2.0 2.0  --   --   --   PHOS  --  6.2*  --   --   --     ABG: Recent Labs  Lab 08/01/20 1615 08/01/20 1808  PHART 7.392 7.411  PCO2ART 53.4* 50.2*  PO2ART 78.3* 78.7*  HCO3 31.8* 31.3*  O2SAT 95.2 95.4    Liver Function Tests: Recent Labs  Lab 08/07/20 0345  AST 25  ALT 12  ALKPHOS 60  BILITOT 0.3  PROT 7.8  ALBUMIN 2.4*   Recent Labs  Lab 08/02/20 0440  LIPASE 32  AMYLASE 14*   Recent Labs  Lab 08/05/20 1157  AMMONIA 29    CBC: Recent Labs  Lab 08/03/20 0329  08/05/20 0459 08/07/20 0345  WBC 10.2 8.7 14.6*  NEUTROABS  --  5.5  --   HGB 10.0* 9.7* 9.2*  HCT 33.1* 32.8* 31.5*  MCV 95.7 97.3 98.4  PLT 419* 349 308    Cardiac Enzymes: No results for input(s): CKTOTAL, CKMB, CKMBINDEX, TROPONINI in the last 168 hours.  BNP (last 3 results) No results for input(s): BNP in the last 8760 hours.  ProBNP (last 3 results) No results for input(s): PROBNP in the last 8760 hours.  Radiological Exams: CT ABDOMEN PELVIS W CONTRAST  Result Date: 08/07/2020 CLINICAL DATA:  Abdominal pain, fever EXAM: CT ABDOMEN AND PELVIS WITH CONTRAST TECHNIQUE: Multidetector CT imaging of the abdomen and pelvis was performed using the standard protocol following bolus administration of intravenous contrast. CONTRAST:  16mL OMNIPAQUE IOHEXOL 300 MG/ML  SOLN COMPARISON:  07/24/2020 FINDINGS: Lower chest: There is persistent bilateral lower lobe consolidation, increased since prior study. Persistent small left pleural effusion. No pneumothorax. Hepatobiliary: No focal liver abnormality is seen. Status post cholecystectomy. No biliary dilatation. Pancreas: Unremarkable. No pancreatic ductal dilatation or surrounding inflammatory changes. Spleen: Normal in size without focal abnormality. Adrenals/Urinary Tract: No urinary tract calculi or obstructive uropathy. The adrenals are unremarkable. Bladder is decompressed with a Foley  catheter. Stomach/Bowel: No bowel obstruction or ileus. Rectal tube is identified. No bowel wall thickening or inflammatory change. Normal appendix right lower quadrant. Vascular/Lymphatic: Stable borderline enlarged retroperitoneal lymph nodes, largest lymph node in the aortocaval region image 40/3 measuring 13 mm and stable. Minimal atherosclerosis of the aorta again noted. Reproductive: Prostate is unremarkable. Other: No free fluid or free gas.  No abdominal wall hernia. Musculoskeletal: No acute or destructive bony lesions. Reconstructed images demonstrate no  additional findings. IMPRESSION: 1. Progressive bilateral lower lobe consolidation with stable left pleural effusion. Findings may reflect worsening atelectasis or pneumonia. 2. Persistent nonspecific borderline enlarged retroperitoneal lymph nodes. 3.  Aortic Atherosclerosis (ICD10-I70.0). Electronically Signed   By: Sharlet Salina M.D.   On: 08/07/2020 15:56   DG Chest Port 1 View  Result Date: 08/07/2020 CLINICAL DATA:  Respiratory failure. EXAM: PORTABLE CHEST 1 VIEW COMPARISON:  08/04/2020. FINDINGS: Tracheostomy tube noted good anatomic position. Heart size stable. Bibasilar atelectasis. Bibasilar pulmonary infiltrates/edema again noted. Small left pleural effusion again noted. IMPRESSION: 1.  Tracheostomy tube in stable position. 2. Bibasilar atelectasis. Bibasilar pulmonary infiltrates/edema again noted. Small left pleural effusion again noted. Electronically Signed   By: Maisie Fus  Register   On: 08/07/2020 06:31    Assessment/Plan Active Problems:   Acute on chronic respiratory failure with hypoxia (HCC)   Severe sepsis (HCC)   Obstructive sleep apnea   History of alcohol abuse   Acute renal failure due to tubular necrosis (HCC)   1. Acute on chronic respiratory failure with hypoxia we will continue with full support we will currently treat for was not able to tolerate weaning. 2. Severe sepsis treated resolving still patient has low-grade fevers noted 3. History of alcohol abuse no change we will continue to monitor. 4. Acute renal failure we will continue to follow along 5. Obstructive sleep apnea nonissue at this time   I have personally seen and evaluated the patient, evaluated laboratory and imaging results, formulated the assessment and plan and placed orders. The Patient requires high complexity decision making with multiple systems involvement.  Rounds were done with the Respiratory Therapy Director and Staff therapists and discussed with nursing staff also.  Yevonne Pax,  MD [facility] Pulmonary Critical Care Medicine Sleep Medicine

## 2020-08-09 DIAGNOSIS — G4733 Obstructive sleep apnea (adult) (pediatric): Secondary | ICD-10-CM | POA: Diagnosis not present

## 2020-08-09 DIAGNOSIS — J9621 Acute and chronic respiratory failure with hypoxia: Secondary | ICD-10-CM | POA: Diagnosis not present

## 2020-08-09 DIAGNOSIS — N170 Acute kidney failure with tubular necrosis: Secondary | ICD-10-CM | POA: Diagnosis not present

## 2020-08-09 DIAGNOSIS — F1011 Alcohol abuse, in remission: Secondary | ICD-10-CM | POA: Diagnosis not present

## 2020-08-09 LAB — BASIC METABOLIC PANEL
Anion gap: 15 (ref 5–15)
BUN: 51 mg/dL — ABNORMAL HIGH (ref 6–20)
CO2: 22 mmol/L (ref 22–32)
Calcium: 8.7 mg/dL — ABNORMAL LOW (ref 8.9–10.3)
Chloride: 109 mmol/L (ref 98–111)
Creatinine, Ser: 1.21 mg/dL (ref 0.61–1.24)
GFR, Estimated: >60 mL/min (ref >60)
Glucose, Bld: 267 mg/dL — ABNORMAL HIGH (ref 70–99)
Potassium: 4.5 mmol/L (ref 3.5–5.1)
Sodium: 146 mmol/L — ABNORMAL HIGH (ref 135–145)

## 2020-08-09 LAB — VANCOMYCIN, TROUGH: Vancomycin Tr: 15 ug/mL (ref 15–20)

## 2020-08-09 NOTE — Progress Notes (Signed)
Pulmonary Critical Care Medicine [facility] [place]   PULMONARY CRITICAL CARE SERVICE  PROGRESS NOTE     Alex Rojas  [id number]:[id number]  DOB: [birth year]   DOA: 07/14/2020  Referring Physician: Carron Curie, MD  HPI: Alex Rojas is a 50 y.o. male seen for follow up of Acute on Chronic Respiratory Failure.  On full support on the ventilator right now on assist control mode has been requiring 60% FiO2  Medications: Reviewed on Rounds  Physical Exam:  Vitals: Temperature is 99.5 pulse 115 respiratory 28 blood pressure is 117/64 saturations 99%  Ventilator Settings on assist control FiO2 60% tidal volume 500 with a PEEP of 5  . General: Comfortable at this time . Eyes: Grossly normal lids, irises & conjunctiva . ENT: grossly tongue is normal . Neck: no obvious mass . Cardiovascular: S1 S2 normal no gallop . Respiratory: Scattered rhonchi expansion is equal . Abdomen: soft . Skin: no rash seen on limited exam . Musculoskeletal: not rigid . Psychiatric:unable to assess . Neurologic: no seizure no involuntary movements         Lab Data:   Basic Metabolic Panel: Recent Labs  Lab 08/03/20 0329 08/05/20 0459 08/07/20 0345 08/08/20 0431 08/09/20 0018  NA 142 144 145 147* 146*  K 5.0 4.9 5.7* 4.8 4.5  CL 101 103 106 109 109  CO2 31 31 29 27 22   GLUCOSE 155* 134* 195* 177* 267*  BUN 47* 54* 53* 51* 51*  CREATININE 0.87 1.09 1.40* 1.32* 1.21  CALCIUM 10.3 9.6 8.7* 8.6* 8.7*  MG 2.0  --   --   --   --   PHOS 6.2*  --   --   --   --     ABG: No results for input(s): PHART, PCO2ART, PO2ART, HCO3, O2SAT in the last 168 hours.  Liver Function Tests: Recent Labs  Lab 08/07/20 0345  AST 25  ALT 12  ALKPHOS 60  BILITOT 0.3  PROT 7.8  ALBUMIN 2.4*   No results for input(s): LIPASE, AMYLASE in the last 168 hours. Recent Labs  Lab 08/05/20 1157  AMMONIA 29    CBC: Recent Labs  Lab 08/03/20 0329 08/05/20 0459 08/07/20 0345  WBC  10.2 8.7 14.6*  NEUTROABS  --  5.5  --   HGB 10.0* 9.7* 9.2*  HCT 33.1* 32.8* 31.5*  MCV 95.7 97.3 98.4  PLT 419* 349 308    Cardiac Enzymes: No results for input(s): CKTOTAL, CKMB, CKMBINDEX, TROPONINI in the last 168 hours.  BNP (last 3 results) No results for input(s): BNP in the last 8760 hours.  ProBNP (last 3 results) No results for input(s): PROBNP in the last 8760 hours.  Radiological Exams: CT ABDOMEN PELVIS W CONTRAST  Result Date: 08/07/2020 CLINICAL DATA:  Abdominal pain, fever EXAM: CT ABDOMEN AND PELVIS WITH CONTRAST TECHNIQUE: Multidetector CT imaging of the abdomen and pelvis was performed using the standard protocol following bolus administration of intravenous contrast. CONTRAST:  77mL OMNIPAQUE IOHEXOL 300 MG/ML  SOLN COMPARISON:  07/24/2020 FINDINGS: Lower chest: There is persistent bilateral lower lobe consolidation, increased since prior study. Persistent small left pleural effusion. No pneumothorax. Hepatobiliary: No focal liver abnormality is seen. Status post cholecystectomy. No biliary dilatation. Pancreas: Unremarkable. No pancreatic ductal dilatation or surrounding inflammatory changes. Spleen: Normal in size without focal abnormality. Adrenals/Urinary Tract: No urinary tract calculi or obstructive uropathy. The adrenals are unremarkable. Bladder is decompressed with a Foley catheter. Stomach/Bowel: No bowel obstruction or ileus. Rectal tube is  identified. No bowel wall thickening or inflammatory change. Normal appendix right lower quadrant. Vascular/Lymphatic: Stable borderline enlarged retroperitoneal lymph nodes, largest lymph node in the aortocaval region image 40/3 measuring 13 mm and stable. Minimal atherosclerosis of the aorta again noted. Reproductive: Prostate is unremarkable. Other: No free fluid or free gas.  No abdominal wall hernia. Musculoskeletal: No acute or destructive bony lesions. Reconstructed images demonstrate no additional findings. IMPRESSION: 1.  Progressive bilateral lower lobe consolidation with stable left pleural effusion. Findings may reflect worsening atelectasis or pneumonia. 2. Persistent nonspecific borderline enlarged retroperitoneal lymph nodes. 3.  Aortic Atherosclerosis (ICD10-I70.0). Electronically Signed   By: Sharlet Salina M.D.   On: 08/07/2020 15:56    Assessment/Plan Active Problems:   Acute on chronic respiratory failure with hypoxia (HCC)   Severe sepsis (HCC)   Obstructive sleep apnea   History of alcohol abuse   Acute renal failure due to tubular necrosis (HCC)   1. Acute on chronic respiratory failure hypoxia we will continue with full support on assist control patient right now is on 60% FiO2 good saturations. 2. Severe sepsis treated resolved 3. Obstructive sleep apnea no change 4. History of alcohol abuse at baseline 5. Acute renal failure following labs   I have personally seen and evaluated the patient, evaluated laboratory and imaging results, formulated the assessment and plan and placed orders. The Patient requires high complexity decision making with multiple systems involvement.  Rounds were done with the Respiratory Therapy Director and Staff therapists and discussed with nursing staff also.  Yevonne Pax, MD [facility] Pulmonary Critical Care Medicine Sleep Medicine

## 2020-08-10 LAB — CULTURE, BLOOD (ROUTINE X 2): Culture: NO GROWTH

## 2020-08-10 LAB — BASIC METABOLIC PANEL
Anion gap: 11 (ref 5–15)
BUN: 47 mg/dL — ABNORMAL HIGH (ref 6–20)
CO2: 25 mmol/L (ref 22–32)
Calcium: 8.6 mg/dL — ABNORMAL LOW (ref 8.9–10.3)
Chloride: 109 mmol/L (ref 98–111)
Creatinine, Ser: 1.1 mg/dL (ref 0.61–1.24)
GFR, Estimated: >60 mL/min (ref >60)
Glucose, Bld: 163 mg/dL — ABNORMAL HIGH (ref 70–99)
Potassium: 4.3 mmol/L (ref 3.5–5.1)
Sodium: 145 mmol/L (ref 135–145)

## 2020-08-10 LAB — CBC
HCT: 25.6 % — ABNORMAL LOW (ref 39.0–52.0)
Hemoglobin: 7.2 g/dL — ABNORMAL LOW (ref 13.0–17.0)
MCH: 27.4 pg (ref 26.0–34.0)
MCHC: 28.1 g/dL — ABNORMAL LOW (ref 30.0–36.0)
MCV: 97.3 fL (ref 80.0–100.0)
Platelets: 281 10*3/uL (ref 150–400)
RBC: 2.63 MIL/uL — ABNORMAL LOW (ref 4.22–5.81)
RDW: 18.3 % — ABNORMAL HIGH (ref 11.5–15.5)
WBC: 14.1 10*3/uL — ABNORMAL HIGH (ref 4.0–10.5)
nRBC: 0.4 % — ABNORMAL HIGH (ref 0.0–0.2)

## 2020-08-10 LAB — MAGNESIUM: Magnesium: 2.6 mg/dL — ABNORMAL HIGH (ref 1.7–2.4)

## 2020-08-11 ENCOUNTER — Other Ambulatory Visit (HOSPITAL_COMMUNITY): Payer: No Typology Code available for payment source

## 2020-08-11 DIAGNOSIS — N170 Acute kidney failure with tubular necrosis: Secondary | ICD-10-CM | POA: Diagnosis not present

## 2020-08-11 DIAGNOSIS — G4733 Obstructive sleep apnea (adult) (pediatric): Secondary | ICD-10-CM | POA: Diagnosis not present

## 2020-08-11 DIAGNOSIS — F1011 Alcohol abuse, in remission: Secondary | ICD-10-CM | POA: Diagnosis not present

## 2020-08-11 DIAGNOSIS — J9621 Acute and chronic respiratory failure with hypoxia: Secondary | ICD-10-CM | POA: Diagnosis not present

## 2020-08-11 LAB — CBC
HCT: 26.6 % — ABNORMAL LOW (ref 39.0–52.0)
Hemoglobin: 7.8 g/dL — ABNORMAL LOW (ref 13.0–17.0)
MCH: 28.3 pg (ref 26.0–34.0)
MCHC: 29.3 g/dL — ABNORMAL LOW (ref 30.0–36.0)
MCV: 96.4 fL (ref 80.0–100.0)
Platelets: 360 10*3/uL (ref 150–400)
RBC: 2.76 MIL/uL — ABNORMAL LOW (ref 4.22–5.81)
RDW: 18.3 % — ABNORMAL HIGH (ref 11.5–15.5)
WBC: 13.7 10*3/uL — ABNORMAL HIGH (ref 4.0–10.5)
nRBC: 0.7 % — ABNORMAL HIGH (ref 0.0–0.2)

## 2020-08-11 LAB — RENAL FUNCTION PANEL
Albumin: 1.9 g/dL — ABNORMAL LOW (ref 3.5–5.0)
Anion gap: 7 (ref 5–15)
BUN: 49 mg/dL — ABNORMAL HIGH (ref 6–20)
CO2: 24 mmol/L (ref 22–32)
Calcium: 8.7 mg/dL — ABNORMAL LOW (ref 8.9–10.3)
Chloride: 110 mmol/L (ref 98–111)
Creatinine, Ser: 0.92 mg/dL (ref 0.61–1.24)
GFR, Estimated: >60 mL/min (ref >60)
Glucose, Bld: 164 mg/dL — ABNORMAL HIGH (ref 70–99)
Phosphorus: 5.1 mg/dL — ABNORMAL HIGH (ref 2.5–4.6)
Potassium: 4.7 mmol/L (ref 3.5–5.1)
Sodium: 141 mmol/L (ref 135–145)

## 2020-08-11 LAB — MAGNESIUM: Magnesium: 2.8 mg/dL — ABNORMAL HIGH (ref 1.7–2.4)

## 2020-08-11 LAB — VANCOMYCIN, TROUGH: Vancomycin Tr: 13 ug/mL — ABNORMAL LOW (ref 15–20)

## 2020-08-11 NOTE — Progress Notes (Signed)
Pulmonary Critical Care Medicine [facility] [place]   PULMONARY CRITICAL CARE SERVICE  PROGRESS NOTE     Alex Rojas  YQI:[id number]  DOB: 1970/12/28   DOA: 07/14/2020  Referring Physician: Carron Curie, MD  HPI: Alex Rojas is a 50 y.o. male seen for follow up of Acute on Chronic Respiratory Failure.  Patient at this time is on assist control mode has been having ongoing fevers still now with a low-grade fever between 99.9 up to 100.7  Medications: Reviewed on Rounds  Physical Exam:  Vitals: Temperature is 99.9 pulse 103 respiratory 26 blood pressure is 157/76 saturations 92%  Ventilator Settings assist-control FiO2 is 40% tidal volume 500 PEEP 5  . General: Comfortable at this time . Eyes: Grossly normal lids, irises & conjunctiva . ENT: grossly tongue is normal . Neck: no obvious mass . Cardiovascular: S1 S2 normal no gallop . Respiratory: Scattered rhonchi expansion equal . Abdomen: soft . Skin: no rash seen on limited exam . Musculoskeletal: not rigid . Psychiatric:unable to assess . Neurologic: no seizure no involuntary movements         Lab Data:   Basic Metabolic Panel: Recent Labs  Lab 08/05/20 0459 08/07/20 0345 08/08/20 0431 08/09/20 0018 08/10/20 0220  NA 144 145 147* 146* 145  K 4.9 5.7* 4.8 4.5 4.3  CL 103 106 109 109 109  CO2 31 29 27 22 25   GLUCOSE 134* 195* 177* 267* 163*  BUN 54* 53* 51* 51* 47*  CREATININE 1.09 1.40* 1.32* 1.21 1.10  CALCIUM 9.6 8.7* 8.6* 8.7* 8.6*  MG  --   --   --   --  2.6*    ABG: No results for input(s): PHART, PCO2ART, PO2ART, HCO3, O2SAT in the last 168 hours.  Liver Function Tests: Recent Labs  Lab 08/07/20 0345  AST 25  ALT 12  ALKPHOS 60  BILITOT 0.3  PROT 7.8  ALBUMIN 2.4*   No results for input(s): LIPASE, AMYLASE in the last 168 hours. Recent Labs  Lab 08/05/20 1157  AMMONIA 29    CBC: Recent Labs  Lab 08/05/20 0459 08/07/20 0345 08/10/20 0220  WBC 8.7  14.6* 14.1*  NEUTROABS 5.5  --   --   HGB 9.7* 9.2* 7.2*  HCT 32.8* 31.5* 25.6*  MCV 97.3 98.4 97.3  PLT 349 308 281    Cardiac Enzymes: No results for input(s): CKTOTAL, CKMB, CKMBINDEX, TROPONINI in the last 168 hours.  BNP (last 3 results) No results for input(s): BNP in the last 8760 hours.  ProBNP (last 3 results) No results for input(s): PROBNP in the last 8760 hours.  Radiological Exams: DG Chest Port 1 View  Result Date: 08/11/2020 CLINICAL DATA:  50 year old male status post sepsis. Respiratory failure, tracheostomy. EXAM: PORTABLE CHEST 1 VIEW COMPARISON:  Portable chest 08/07/2020 and earlier. FINDINGS: Portable AP semi upright view at 0556 hours. Stable tracheostomy. Stable cardiac size and mediastinal contours. Stable lung volumes. Improved ventilation at the left lung base since 08/07/2020 with residual peribronchial opacity in the lower lobe but mostly resolved consolidation there. However, there is worsening opacity at the right lung base with new obscuration of the right hemidiaphragm. Right upper lung is stable. No pneumothorax. Paucity of bowel gas in the upper abdomen. No acute osseous abnormality identified. IMPRESSION: 1. Confluent new right lung base opacity since 08/07/2020 more suspicious for pneumonia and/or aspiration than atelectasis. 2. Interval regressed left lung base consolidation. Electronically Signed   By: 10/07/2020.D.  On: 08/11/2020 06:38    Assessment/Plan Active Problems:   Acute on chronic respiratory failure with hypoxia (HCC)   Severe sepsis (HCC)   Obstructive sleep apnea   History of alcohol abuse   Acute renal failure due to tubular necrosis (HCC)   1. Acute on chronic respiratory failure with hypoxia patient continues to be on the ventilator and full support currently on assist control mode.  Reviewed the chest x-ray showing a new basal can saltation on the right side.  Because of the ongoing fevers and suggested doing bronchoscopy for  cultures 2. Severe sepsis has been on multiple rounds of antibiotics 3. Possible pneumonia will get bronchoscopy scheduled for cultures. 4. Acute renal failure following levels 5. OSA nonissue 6. Alcohol abuse at baseline   I have personally seen and evaluated the patient, evaluated laboratory and imaging results, formulated the assessment and plan and placed orders. The Patient requires high complexity decision making with multiple systems involvement.  Rounds were done with the Respiratory Therapy Director and Staff therapists and discussed with nursing staff also.  Yevonne Pax, MD [facility] Pulmonary Critical Care Medicine Sleep Medicine

## 2020-08-12 ENCOUNTER — Other Ambulatory Visit (HOSPITAL_COMMUNITY): Payer: No Typology Code available for payment source

## 2020-08-12 DIAGNOSIS — G4733 Obstructive sleep apnea (adult) (pediatric): Secondary | ICD-10-CM | POA: Diagnosis not present

## 2020-08-12 DIAGNOSIS — F1011 Alcohol abuse, in remission: Secondary | ICD-10-CM | POA: Diagnosis not present

## 2020-08-12 DIAGNOSIS — N170 Acute kidney failure with tubular necrosis: Secondary | ICD-10-CM | POA: Diagnosis not present

## 2020-08-12 DIAGNOSIS — J9621 Acute and chronic respiratory failure with hypoxia: Secondary | ICD-10-CM | POA: Diagnosis not present

## 2020-08-12 LAB — NOVEL CORONAVIRUS, NAA (HOSP ORDER, SEND-OUT TO REF LAB; TAT 18-24 HRS): SARS-CoV-2, NAA: NOT DETECTED

## 2020-08-12 NOTE — Progress Notes (Signed)
Pulmonary Critical Care Medicine [facility] [place]   PULMONARY CRITICAL CARE SERVICE  PROGRESS NOTE     Alex Rojas  [id number]  DOB: Mar 22, 1971   DOA: 07/14/2020  Referring Physician: Carron Curie, MD  HPI: Alex Rojas is a 50 y.o. male seen for follow up of Acute on Chronic Respiratory Failure.  Patient is got a low-grade fever noted.  This morning we were planning on doing bronchoscopy however his oxygen requirements are at 75% and it may be somewhat risky to try to perform bronchoscopy so therefore we will postpone it  Medications: Reviewed on Rounds  Physical Exam:  Vitals: Temperature is 100.8 pulse 100 respiratory rate 23 blood pressure is 130/68 saturations 92%  Ventilator Settings on assist control FiO2 is 75% tidal line 500 PEEP 5  . General: Comfortable at this time . Eyes: Grossly normal lids, irises & conjunctiva . ENT: grossly tongue is normal . Neck: no obvious mass . Cardiovascular: S1 S2 normal no gallop . Respiratory: Scattered rhonchi expansion equal . Abdomen: soft . Skin: no rash seen on limited exam . Musculoskeletal: not rigid . Psychiatric:unable to assess . Neurologic: no seizure no involuntary movements         Lab Data:   Basic Metabolic Panel: Recent Labs  Lab 08/07/20 0345 08/08/20 0431 08/09/20 0018 08/10/20 0220 08/11/20 0910  NA 145 147* 146* 145 141  K 5.7* 4.8 4.5 4.3 4.7  CL 106 109 109 109 110  CO2 29 27 22 25 24   GLUCOSE 195* 177* 267* 163* 164*  BUN 53* 51* 51* 47* 49*  CREATININE 1.40* 1.32* 1.21 1.10 0.92  CALCIUM 8.7* 8.6* 8.7* 8.6* 8.7*  MG  --   --   --  2.6* 2.8*  PHOS  --   --   --   --  5.1*    ABG: No results for input(s): PHART, PCO2ART, PO2ART, HCO3, O2SAT in the last 168 hours.  Liver Function Tests: Recent Labs  Lab 08/07/20 0345 08/11/20 0910  AST 25  --   ALT 12  --   ALKPHOS 60  --   BILITOT 0.3  --   PROT 7.8  --   ALBUMIN 2.4* 1.9*   No results for  input(s): LIPASE, AMYLASE in the last 168 hours. No results for input(s): AMMONIA in the last 168 hours.  CBC: Recent Labs  Lab 08/07/20 0345 08/10/20 0220 08/11/20 0910  WBC 14.6* 14.1* 13.7*  HGB 9.2* 7.2* 7.8*  HCT 31.5* 25.6* 26.6*  MCV 98.4 97.3 96.4  PLT 308 281 360    Cardiac Enzymes: No results for input(s): CKTOTAL, CKMB, CKMBINDEX, TROPONINI in the last 168 hours.  BNP (last 3 results) No results for input(s): BNP in the last 8760 hours.  ProBNP (last 3 results) No results for input(s): PROBNP in the last 8760 hours.  Radiological Exams: DG Chest Port 1 View  Result Date: 08/11/2020 CLINICAL DATA:  50 year old male status post sepsis. Respiratory failure, tracheostomy. EXAM: PORTABLE CHEST 1 VIEW COMPARISON:  Portable chest 08/07/2020 and earlier. FINDINGS: Portable AP semi upright view at 0556 hours. Stable tracheostomy. Stable cardiac size and mediastinal contours. Stable lung volumes. Improved ventilation at the left lung base since 08/07/2020 with residual peribronchial opacity in the lower lobe but mostly resolved consolidation there. However, there is worsening opacity at the right lung base with new obscuration of the right hemidiaphragm. Right upper lung is stable. No pneumothorax. Paucity of bowel gas in the upper abdomen. No acute  osseous abnormality identified. IMPRESSION: 1. Confluent new right lung base opacity since 08/07/2020 more suspicious for pneumonia and/or aspiration than atelectasis. 2. Interval regressed left lung base consolidation. Electronically Signed   By: Odessa Fleming M.D.   On: 08/11/2020 06:38    Assessment/Plan Active Problems:   Acute on chronic respiratory failure with hypoxia (HCC)   Severe sepsis (HCC)   Obstructive sleep apnea   History of alcohol abuse   Acute renal failure due to tubular necrosis (HCC)   1. Acute on chronic respiratory failure hypoxia the plan is going to be to hold off on the bronchoscopy for now.  In the meantime we  will continue to try to titrate oxygen and adjust PEEP.  Would consider getting a follow-up ABG. 2. Severe sepsis patient has been on antibiotics and remains on the antibiotics which will be continued. 3. Obstructive sleep apnea nonissue 4. Alcohol abuse nonissue we will continue to monitor. 5. Acute renal failure tubular necrosis patient is at baseline   I have personally seen and evaluated the patient, evaluated laboratory and imaging results, formulated the assessment and plan and placed orders. The Patient requires high complexity decision making with multiple systems involvement.  Rounds were done with the Respiratory Therapy Director and Staff therapists and discussed with nursing staff also.  Yevonne Pax, MD [facility] Pulmonary Critical Care Medicine Sleep Medicine

## 2020-08-13 ENCOUNTER — Other Ambulatory Visit (HOSPITAL_COMMUNITY): Payer: No Typology Code available for payment source

## 2020-08-13 DIAGNOSIS — N170 Acute kidney failure with tubular necrosis: Secondary | ICD-10-CM | POA: Diagnosis not present

## 2020-08-13 DIAGNOSIS — F1011 Alcohol abuse, in remission: Secondary | ICD-10-CM | POA: Diagnosis not present

## 2020-08-13 DIAGNOSIS — J9621 Acute and chronic respiratory failure with hypoxia: Secondary | ICD-10-CM | POA: Diagnosis not present

## 2020-08-13 DIAGNOSIS — G4733 Obstructive sleep apnea (adult) (pediatric): Secondary | ICD-10-CM | POA: Diagnosis not present

## 2020-08-13 LAB — CULTURE, BLOOD (ROUTINE X 2)
Culture: NO GROWTH
Culture: NO GROWTH

## 2020-08-13 LAB — BASIC METABOLIC PANEL
Anion gap: 7 (ref 5–15)
BUN: 50 mg/dL — ABNORMAL HIGH (ref 6–20)
CO2: 24 mmol/L (ref 22–32)
Calcium: 9 mg/dL (ref 8.9–10.3)
Chloride: 114 mmol/L — ABNORMAL HIGH (ref 98–111)
Creatinine, Ser: 0.91 mg/dL (ref 0.61–1.24)
GFR, Estimated: >60 mL/min (ref >60)
Glucose, Bld: 191 mg/dL — ABNORMAL HIGH (ref 70–99)
Potassium: 4.8 mmol/L (ref 3.5–5.1)
Sodium: 145 mmol/L (ref 135–145)

## 2020-08-13 LAB — CBC
HCT: 27.1 % — ABNORMAL LOW (ref 39.0–52.0)
Hemoglobin: 7.5 g/dL — ABNORMAL LOW (ref 13.0–17.0)
MCH: 27.5 pg (ref 26.0–34.0)
MCHC: 27.7 g/dL — ABNORMAL LOW (ref 30.0–36.0)
MCV: 99.3 fL (ref 80.0–100.0)
Platelets: 441 10*3/uL — ABNORMAL HIGH (ref 150–400)
RBC: 2.73 MIL/uL — ABNORMAL LOW (ref 4.22–5.81)
RDW: 18.5 % — ABNORMAL HIGH (ref 11.5–15.5)
WBC: 18.3 10*3/uL — ABNORMAL HIGH (ref 4.0–10.5)
nRBC: 1.3 % — ABNORMAL HIGH (ref 0.0–0.2)

## 2020-08-13 LAB — VANCOMYCIN, TROUGH: Vancomycin Tr: 19 ug/mL (ref 15–20)

## 2020-08-13 LAB — MAGNESIUM: Magnesium: 2.3 mg/dL (ref 1.7–2.4)

## 2020-08-13 LAB — PHOSPHORUS: Phosphorus: 4.3 mg/dL (ref 2.5–4.6)

## 2020-08-13 NOTE — Progress Notes (Signed)
Pulmonary Critical Care Medicine [facility] [place]   PULMONARY CRITICAL CARE SERVICE  PROGRESS NOTE     Alex Rojas  VZD:[id number]  DOB: September 27, 1970   DOA: 07/14/2020  Referring Physician: Carron Curie, MD  HPI: Alex Rojas is a 50 y.o. male seen for follow up of Acute on Chronic Respiratory Failure.  Patient is resting comfortably right now without distress at this time remains on the ventilator and full support had to be increased to 100% FiO2 now weaned back down to 85% FiO2.  Continues to have fevers unfortunately  Medications: Reviewed on Rounds  Physical Exam:  Vitals: Temperature is 101.5 pulse is 100 respiratory rate 25 blood pressure is 120/65 saturations 90%  Ventilator Settings on assist control FiO2 85% tidal volume 500 PEEP 5  . General: Comfortable at this time . Eyes: Grossly normal lids, irises & conjunctiva . ENT: grossly tongue is normal . Neck: no obvious mass . Cardiovascular: S1 S2 normal no gallop . Respiratory: No rhonchi very coarse breath sounds are noted . Abdomen: soft . Skin: no rash seen on limited exam . Musculoskeletal: not rigid . Psychiatric:unable to assess . Neurologic: no seizure no involuntary movements         Lab Data:   Basic Metabolic Panel: Recent Labs  Lab 08/08/20 0431 08/09/20 0018 08/10/20 0220 08/11/20 0910 08/13/20 0430  NA 147* 146* 145 141 145  K 4.8 4.5 4.3 4.7 4.8  CL 109 109 109 110 114*  CO2 27 22 25 24 24   GLUCOSE 177* 267* 163* 164* 191*  BUN 51* 51* 47* 49* 50*  CREATININE 1.32* 1.21 1.10 0.92 0.91  CALCIUM 8.6* 8.7* 8.6* 8.7* 9.0  MG  --   --  2.6* 2.8* 2.3  PHOS  --   --   --  5.1* 4.3    ABG: No results for input(s): PHART, PCO2ART, PO2ART, HCO3, O2SAT in the last 168 hours.  Liver Function Tests: Recent Labs  Lab 08/07/20 0345 08/11/20 0910  AST 25  --   ALT 12  --   ALKPHOS 60  --   BILITOT 0.3  --   PROT 7.8  --   ALBUMIN 2.4* 1.9*   No results for  input(s): LIPASE, AMYLASE in the last 168 hours. No results for input(s): AMMONIA in the last 168 hours.  CBC: Recent Labs  Lab 08/07/20 0345 08/10/20 0220 08/11/20 0910 08/13/20 0430  WBC 14.6* 14.1* 13.7* 18.3*  HGB 9.2* 7.2* 7.8* 7.5*  HCT 31.5* 25.6* 26.6* 27.1*  MCV 98.4 97.3 96.4 99.3  PLT 308 281 360 441*    Cardiac Enzymes: No results for input(s): CKTOTAL, CKMB, CKMBINDEX, TROPONINI in the last 168 hours.  BNP (last 3 results) No results for input(s): BNP in the last 8760 hours.  ProBNP (last 3 results) No results for input(s): PROBNP in the last 8760 hours.  Radiological Exams: CT HEAD WO CONTRAST  Result Date: 08/12/2020 CLINICAL DATA:  Mental status change EXAM: CT HEAD WITHOUT CONTRAST TECHNIQUE: Contiguous axial images were obtained from the base of the skull through the vertex without intravenous contrast. COMPARISON:  None. FINDINGS: Brain: Mild motion degradation. No acute territorial infarction, hemorrhage or intracranial mass. Mild atrophy. Nonenlarged ventricles Vascular: No hyperdense vessels.  Carotid vascular calcification Skull: No fracture.  Bilateral mastoid effusion Sinuses/Orbits: Mucosal thickening in the sinuses Other: None IMPRESSION: 1. No CT evidence for acute intracranial abnormality. 2. Mild atrophy 3. Bilateral mastoid effusion Electronically Signed   By: 10/12/2020  Jake Samples M.D.   On: 08/12/2020 16:43   DG CHEST PORT 1 VIEW  Result Date: 08/13/2020 CLINICAL DATA:  Pneumonia, trach placement EXAM: PORTABLE CHEST 1 VIEW COMPARISON:  08/11/2020 FINDINGS: Tracheostomy remains in stable position. Cardiomegaly. Diffuse bilateral airspace disease, likely edema. Suspect small right pleural effusion. No pneumothorax. IMPRESSION: Tracheostomy is unchanged. Bilateral airspace disease, favor edema/CHF. Small right effusion Electronically Signed   By: Charlett Nose M.D.   On: 08/13/2020 09:22    Assessment/Plan Active Problems:   Acute on chronic respiratory  failure with hypoxia (HCC)   Severe sepsis (HCC)   Obstructive sleep apnea   History of alcohol abuse   Acute renal failure due to tubular necrosis (HCC)   1. Acute on chronic respiratory failure with hypoxia patient is continue to monitor well with being able to wean.  Still has fevers noted continues to not really respond to antibiotics consider viral etiology but will discuss with primary team. 2. Severe sepsis patient's hemodynamics are stable however still has temperature as above 3. History of alcohol abuse no change 4. Acute renal failure no change we will continue to monitor 5. Obstructive sleep apnea nonissue   I have personally seen and evaluated the patient, evaluated laboratory and imaging results, formulated the assessment and plan and placed orders. The Patient requires high complexity decision making with multiple systems involvement.  Rounds were done with the Respiratory Therapy Director and Staff therapists and discussed with nursing staff also.  Yevonne Pax, MD [facility] Pulmonary Critical Care Medicine Sleep Medicine

## 2020-08-14 ENCOUNTER — Other Ambulatory Visit (HOSPITAL_COMMUNITY): Payer: No Typology Code available for payment source

## 2020-08-14 DIAGNOSIS — G4733 Obstructive sleep apnea (adult) (pediatric): Secondary | ICD-10-CM | POA: Diagnosis not present

## 2020-08-14 DIAGNOSIS — J9621 Acute and chronic respiratory failure with hypoxia: Secondary | ICD-10-CM | POA: Diagnosis not present

## 2020-08-14 DIAGNOSIS — N170 Acute kidney failure with tubular necrosis: Secondary | ICD-10-CM | POA: Diagnosis not present

## 2020-08-14 DIAGNOSIS — F1011 Alcohol abuse, in remission: Secondary | ICD-10-CM | POA: Diagnosis not present

## 2020-08-14 LAB — RENAL FUNCTION PANEL
Albumin: 1.9 g/dL — ABNORMAL LOW (ref 3.5–5.0)
Anion gap: 4 — ABNORMAL LOW (ref 5–15)
BUN: 53 mg/dL — ABNORMAL HIGH (ref 6–20)
CO2: 26 mmol/L (ref 22–32)
Calcium: 9.3 mg/dL (ref 8.9–10.3)
Chloride: 116 mmol/L — ABNORMAL HIGH (ref 98–111)
Creatinine, Ser: 0.95 mg/dL (ref 0.61–1.24)
GFR, Estimated: >60 mL/min (ref >60)
Glucose, Bld: 286 mg/dL — ABNORMAL HIGH (ref 70–99)
Phosphorus: 4.1 mg/dL (ref 2.5–4.6)
Potassium: 5.5 mmol/L — ABNORMAL HIGH (ref 3.5–5.1)
Sodium: 146 mmol/L — ABNORMAL HIGH (ref 135–145)

## 2020-08-14 LAB — URINALYSIS, ROUTINE W REFLEX MICROSCOPIC
Bilirubin Urine: NEGATIVE
Glucose, UA: 50 mg/dL — AB
Ketones, ur: NEGATIVE mg/dL
Nitrite: NEGATIVE
Protein, ur: 30 mg/dL — AB
Specific Gravity, Urine: 1.018 (ref 1.005–1.030)
pH: 6 (ref 5.0–8.0)

## 2020-08-14 LAB — CBC
HCT: 26.1 % — ABNORMAL LOW (ref 39.0–52.0)
Hemoglobin: 7.2 g/dL — ABNORMAL LOW (ref 13.0–17.0)
MCH: 27.5 pg (ref 26.0–34.0)
MCHC: 27.6 g/dL — ABNORMAL LOW (ref 30.0–36.0)
MCV: 99.6 fL (ref 80.0–100.0)
Platelets: 466 10*3/uL — ABNORMAL HIGH (ref 150–400)
RBC: 2.62 MIL/uL — ABNORMAL LOW (ref 4.22–5.81)
RDW: 18.6 % — ABNORMAL HIGH (ref 11.5–15.5)
WBC: 19.2 10*3/uL — ABNORMAL HIGH (ref 4.0–10.5)
nRBC: 1.2 % — ABNORMAL HIGH (ref 0.0–0.2)

## 2020-08-14 LAB — HIV ANTIBODY (ROUTINE TESTING W REFLEX): HIV Screen 4th Generation wRfx: NONREACTIVE

## 2020-08-14 LAB — MAGNESIUM: Magnesium: 1.9 mg/dL (ref 1.7–2.4)

## 2020-08-14 LAB — RPR: RPR Ser Ql: NONREACTIVE

## 2020-08-14 NOTE — Progress Notes (Signed)
  Echocardiogram 2D Echocardiogram has been performed.  Delcie Roch 08/14/2020, 4:46 PM

## 2020-08-14 NOTE — Progress Notes (Signed)
Pulmonary Critical Care Medicine [facility] [place]   PULMONARY CRITICAL CARE SERVICE  PROGRESS NOTE     Alex Rojas  SWF:[id number]  DOB: May 27, 1970   DOA: 07/14/2020  Referring Physician: Carron Curie, MD  HPI: Alex Rojas is a 50 y.o. male seen for follow up of Acute on Chronic Respiratory Failure.  Patient is on the ventilator and full support right now continues to unfortunately have temperatures  Medications: Reviewed on Rounds  Physical Exam:  Vitals: Temperature is 102.6 pulse 125 respiratory 19 blood pressure is 137/67 saturations 92%  Ventilator Settings on assist control FiO2 75% tidal volume 500 PEEP 7  . General: Comfortable at this time . Eyes: Grossly normal lids, irises & conjunctiva . ENT: grossly tongue is normal . Neck: no obvious mass . Cardiovascular: S1 S2 normal no gallop . Respiratory: Scattered rhonchi expansion is equal . Abdomen: soft . Skin: no rash seen on limited exam . Musculoskeletal: not rigid . Psychiatric:unable to assess . Neurologic: no seizure no involuntary movements         Lab Data:   Basic Metabolic Panel: Recent Labs  Lab 08/09/20 0018 08/10/20 0220 08/11/20 0910 08/13/20 0430 08/14/20 0540  NA 146* 145 141 145 146*  K 4.5 4.3 4.7 4.8 5.5*  CL 109 109 110 114* 116*  CO2 22 25 24 24 26   GLUCOSE 267* 163* 164* 191* 286*  BUN 51* 47* 49* 50* 53*  CREATININE 1.21 1.10 0.92 0.91 0.95  CALCIUM 8.7* 8.6* 8.7* 9.0 9.3  MG  --  2.6* 2.8* 2.3 1.9  PHOS  --   --  5.1* 4.3 4.1    ABG: No results for input(s): PHART, PCO2ART, PO2ART, HCO3, O2SAT in the last 168 hours.  Liver Function Tests: Recent Labs  Lab 08/11/20 0910 08/14/20 0540  ALBUMIN 1.9* 1.9*   No results for input(s): LIPASE, AMYLASE in the last 168 hours. No results for input(s): AMMONIA in the last 168 hours.  CBC: Recent Labs  Lab 08/10/20 0220 08/11/20 0910 08/13/20 0430 08/14/20 0540  WBC 14.1* 13.7* 18.3*  19.2*  HGB 7.2* 7.8* 7.5* 7.2*  HCT 25.6* 26.6* 27.1* 26.1*  MCV 97.3 96.4 99.3 99.6  PLT 281 360 441* 466*    Cardiac Enzymes: No results for input(s): CKTOTAL, CKMB, CKMBINDEX, TROPONINI in the last 168 hours.  BNP (last 3 results) No results for input(s): BNP in the last 8760 hours.  ProBNP (last 3 results) No results for input(s): PROBNP in the last 8760 hours.  Radiological Exams: CT HEAD WO CONTRAST  Result Date: 08/12/2020 CLINICAL DATA:  Mental status change EXAM: CT HEAD WITHOUT CONTRAST TECHNIQUE: Contiguous axial images were obtained from the base of the skull through the vertex without intravenous contrast. COMPARISON:  None. FINDINGS: Brain: Mild motion degradation. No acute territorial infarction, hemorrhage or intracranial mass. Mild atrophy. Nonenlarged ventricles Vascular: No hyperdense vessels.  Carotid vascular calcification Skull: No fracture.  Bilateral mastoid effusion Sinuses/Orbits: Mucosal thickening in the sinuses Other: None IMPRESSION: 1. No CT evidence for acute intracranial abnormality. 2. Mild atrophy 3. Bilateral mastoid effusion Electronically Signed   By: 10/12/2020 M.D.   On: 08/12/2020 16:43   DG Chest Port 1 View  Result Date: 08/14/2020 CLINICAL DATA:  Pulmonary edema EXAM: PORTABLE CHEST 1 VIEW COMPARISON:  Aug 13, 2020 FINDINGS: Tracheostomy catheter tip is 6.4 cm above the carina. No pneumothorax. Small pleural effusion on each side with interstitial pulmonary edema present. No appreciable consolidation. There is cardiomegaly  with pulmonary vascularity within normal limits. No evident adenopathy. No bone lesions. IMPRESSION: Tracheostomy as described without pneumothorax. Cardiomegaly with small pleural effusions interstitial edema. Suspect a degree of congestive heart failure. No consolidation. Electronically Signed   By: Bretta Bang III M.D.   On: 08/14/2020 08:58   DG CHEST PORT 1 VIEW  Result Date: 08/13/2020 CLINICAL DATA:  Pneumonia,  trach placement EXAM: PORTABLE CHEST 1 VIEW COMPARISON:  08/11/2020 FINDINGS: Tracheostomy remains in stable position. Cardiomegaly. Diffuse bilateral airspace disease, likely edema. Suspect small right pleural effusion. No pneumothorax. IMPRESSION: Tracheostomy is unchanged. Bilateral airspace disease, favor edema/CHF. Small right effusion Electronically Signed   By: Charlett Nose M.D.   On: 08/13/2020 09:22    Assessment/Plan Active Problems:   Acute on chronic respiratory failure with hypoxia (HCC)   Severe sepsis (HCC)   Obstructive sleep apnea   History of alcohol abuse   Acute renal failure due to tubular necrosis (HCC)   1. Acute on chronic respiratory failure with hypoxia we will continue with full support on the ventilator.  Spoke to the primary care team regarding fever work-up 2. Severe sepsis hemodynamics are stable but still febrile 3. History of alcohol abuse no change supportive care 4. Acute renal failure supportive care 5. Obstructive sleep apnea nonissue   I have personally seen and evaluated the patient, evaluated laboratory and imaging results, formulated the assessment and plan and placed orders. The Patient requires high complexity decision making with multiple systems involvement.  Rounds were done with the Respiratory Therapy Director and Staff therapists and discussed with nursing staff also.  Yevonne Pax, MD [facility] Pulmonary Critical Care Medicine Sleep Medicine

## 2020-08-15 DIAGNOSIS — J9621 Acute and chronic respiratory failure with hypoxia: Secondary | ICD-10-CM | POA: Diagnosis not present

## 2020-08-15 DIAGNOSIS — N170 Acute kidney failure with tubular necrosis: Secondary | ICD-10-CM | POA: Diagnosis not present

## 2020-08-15 DIAGNOSIS — G4733 Obstructive sleep apnea (adult) (pediatric): Secondary | ICD-10-CM | POA: Diagnosis not present

## 2020-08-15 DIAGNOSIS — F1011 Alcohol abuse, in remission: Secondary | ICD-10-CM | POA: Diagnosis not present

## 2020-08-15 LAB — CBC
HCT: 26.5 % — ABNORMAL LOW (ref 39.0–52.0)
Hemoglobin: 7.5 g/dL — ABNORMAL LOW (ref 13.0–17.0)
MCH: 27.7 pg (ref 26.0–34.0)
MCHC: 28.3 g/dL — ABNORMAL LOW (ref 30.0–36.0)
MCV: 97.8 fL (ref 80.0–100.0)
Platelets: 440 10*3/uL — ABNORMAL HIGH (ref 150–400)
RBC: 2.71 MIL/uL — ABNORMAL LOW (ref 4.22–5.81)
RDW: 18 % — ABNORMAL HIGH (ref 11.5–15.5)
WBC: 13.4 10*3/uL — ABNORMAL HIGH (ref 4.0–10.5)
nRBC: 1 % — ABNORMAL HIGH (ref 0.0–0.2)

## 2020-08-15 LAB — RENAL FUNCTION PANEL
Albumin: 1.7 g/dL — ABNORMAL LOW (ref 3.5–5.0)
Anion gap: 8 (ref 5–15)
BUN: 57 mg/dL — ABNORMAL HIGH (ref 6–20)
CO2: 24 mmol/L (ref 22–32)
Calcium: 9.1 mg/dL (ref 8.9–10.3)
Chloride: 112 mmol/L — ABNORMAL HIGH (ref 98–111)
Creatinine, Ser: 0.74 mg/dL (ref 0.61–1.24)
GFR, Estimated: >60 mL/min (ref >60)
Glucose, Bld: 310 mg/dL — ABNORMAL HIGH (ref 70–99)
Phosphorus: 3.2 mg/dL (ref 2.5–4.6)
Potassium: 4.6 mmol/L (ref 3.5–5.1)
Sodium: 144 mmol/L (ref 135–145)

## 2020-08-15 LAB — HTLV I+II ANTIBODIES, (EIA), BLD: HTLV I/II Ab: NEGATIVE

## 2020-08-15 LAB — MAGNESIUM: Magnesium: 1.8 mg/dL (ref 1.7–2.4)

## 2020-08-15 LAB — VANCOMYCIN, TROUGH: Vancomycin Tr: 21 ug/mL (ref 15–20)

## 2020-08-15 NOTE — Progress Notes (Signed)
Pulmonary Critical Care Medicine [facility] [place]   PULMONARY CRITICAL CARE SERVICE  PROGRESS NOTE     Alex Rojas  [id number]:[id number]  DOB: 15-Feb-1971   DOA: 07/14/2020  Referring Physician: Carron Curie, MD  HPI: Alex Rojas is a 50 y.o. male seen for follow up of Acute on Chronic Respiratory Failure.  Currently patient is on full support and assist control mode has been requiring 50% FiO2 good saturations are noted  Medications: Reviewed on Rounds  Physical Exam:  Vitals: Temperature is 97.9 pulse 108 respiratory 23 blood pressures 131/53 saturations 99%  Ventilator Settings assist control FiO2 60% tidal volume 500 PEEP of 7  . General: Comfortable at this time . Eyes: Grossly normal lids, irises & conjunctiva . ENT: grossly tongue is normal . Neck: no obvious mass . Cardiovascular: S1 S2 normal no gallop . Respiratory: No rhonchi very coarse breath sounds are noted . Abdomen: soft . Skin: no rash seen on limited exam . Musculoskeletal: not rigid . Psychiatric:unable to assess . Neurologic: no seizure no involuntary movements         Lab Data:   Basic Metabolic Panel: Recent Labs  Lab 08/10/20 0220 08/11/20 0910 08/13/20 0430 08/14/20 0540 08/15/20 0352  NA 145 141 145 146* 144  K 4.3 4.7 4.8 5.5* 4.6  CL 109 110 114* 116* 112*  CO2 25 24 24 26 24   GLUCOSE 163* 164* 191* 286* 310*  BUN 47* 49* 50* 53* 57*  CREATININE 1.10 0.92 0.91 0.95 0.74  CALCIUM 8.6* 8.7* 9.0 9.3 9.1  MG 2.6* 2.8* 2.3 1.9 1.8  PHOS  --  5.1* 4.3 4.1 3.2    ABG: No results for input(s): PHART, PCO2ART, PO2ART, HCO3, O2SAT in the last 168 hours.  Liver Function Tests: Recent Labs  Lab 08/11/20 0910 08/14/20 0540 08/15/20 0352  ALBUMIN 1.9* 1.9* 1.7*   No results for input(s): LIPASE, AMYLASE in the last 168 hours. No results for input(s): AMMONIA in the last 168 hours.  CBC: Recent Labs  Lab 08/10/20 0220 08/11/20 0910 08/13/20 0430  08/14/20 0540 08/15/20 0352  WBC 14.1* 13.7* 18.3* 19.2* 13.4*  HGB 7.2* 7.8* 7.5* 7.2* 7.5*  HCT 25.6* 26.6* 27.1* 26.1* 26.5*  MCV 97.3 96.4 99.3 99.6 97.8  PLT 281 360 441* 466* 440*    Cardiac Enzymes: No results for input(s): CKTOTAL, CKMB, CKMBINDEX, TROPONINI in the last 168 hours.  BNP (last 3 results) No results for input(s): BNP in the last 8760 hours.  ProBNP (last 3 results) No results for input(s): PROBNP in the last 8760 hours.  Radiological Exams: DG Chest Port 1 View  Result Date: 08/14/2020 CLINICAL DATA:  Pulmonary edema EXAM: PORTABLE CHEST 1 VIEW COMPARISON:  Aug 13, 2020 FINDINGS: Tracheostomy catheter tip is 6.4 cm above the carina. No pneumothorax. Small pleural effusion on each side with interstitial pulmonary edema present. No appreciable consolidation. There is cardiomegaly with pulmonary vascularity within normal limits. No evident adenopathy. No bone lesions. IMPRESSION: Tracheostomy as described without pneumothorax. Cardiomegaly with small pleural effusions interstitial edema. Suspect a degree of congestive heart failure. No consolidation. Electronically Signed   By: Aug 15, 2020 III M.D.   On: 08/14/2020 08:58    Assessment/Plan Active Problems:   Acute on chronic respiratory failure with hypoxia (HCC)   Severe sepsis (HCC)   Obstructive sleep apnea   History of alcohol abuse   Acute renal failure due to tubular necrosis (HCC)   1. Acute on chronic respiratory failure with  hypoxia plan is going to be to continue with the weaning process as tolerated.  We will continue pulmonary toilet supportive care. 2. Severe sepsis treated we will continue to follow along closely. 3. Obstructive sleep apnea no changes 4. History of alcohol abuse at baseline 5. Acute renal failure supportive care   I have personally seen and evaluated the patient, evaluated laboratory and imaging results, formulated the assessment and plan and placed orders. The Patient  requires high complexity decision making with multiple systems involvement.  Rounds were done with the Respiratory Therapy Director and Staff therapists and discussed with nursing staff also.  Yevonne Pax, MD [facility] Pulmonary Critical Care Medicine Sleep Medicine

## 2020-08-16 LAB — BLOOD GAS, ARTERIAL
Acid-base deficit: 2.4 mmol/L — ABNORMAL HIGH (ref 0.0–2.0)
Bicarbonate: 23.6 mmol/L (ref 20.0–28.0)
FIO2: 75
O2 Saturation: 89.7 %
Patient temperature: 36.3
pCO2 arterial: 51.4 mmHg — ABNORMAL HIGH (ref 32.0–48.0)
pH, Arterial: 7.278 — ABNORMAL LOW (ref 7.350–7.450)
pO2, Arterial: 60 mmHg — ABNORMAL LOW (ref 83.0–108.0)

## 2020-08-16 LAB — CBC
HCT: 23.3 % — ABNORMAL LOW (ref 39.0–52.0)
Hemoglobin: 6.6 g/dL — CL (ref 13.0–17.0)
MCH: 27.6 pg (ref 26.0–34.0)
MCHC: 28.3 g/dL — ABNORMAL LOW (ref 30.0–36.0)
MCV: 97.5 fL (ref 80.0–100.0)
Platelets: 490 10*3/uL — ABNORMAL HIGH (ref 150–400)
RBC: 2.39 MIL/uL — ABNORMAL LOW (ref 4.22–5.81)
RDW: 18.1 % — ABNORMAL HIGH (ref 11.5–15.5)
WBC: 13.3 10*3/uL — ABNORMAL HIGH (ref 4.0–10.5)
nRBC: 1.1 % — ABNORMAL HIGH (ref 0.0–0.2)

## 2020-08-16 LAB — CULTURE, RESPIRATORY W GRAM STAIN

## 2020-08-16 LAB — BASIC METABOLIC PANEL
Anion gap: 8 (ref 5–15)
BUN: 52 mg/dL — ABNORMAL HIGH (ref 6–20)
CO2: 24 mmol/L (ref 22–32)
Calcium: 9.5 mg/dL (ref 8.9–10.3)
Chloride: 114 mmol/L — ABNORMAL HIGH (ref 98–111)
Creatinine, Ser: 0.72 mg/dL (ref 0.61–1.24)
GFR, Estimated: >60 mL/min (ref >60)
Glucose, Bld: 276 mg/dL — ABNORMAL HIGH (ref 70–99)
Potassium: 4.5 mmol/L (ref 3.5–5.1)
Sodium: 146 mmol/L — ABNORMAL HIGH (ref 135–145)

## 2020-08-16 LAB — URINE CULTURE: Culture: NO GROWTH

## 2020-08-16 LAB — PHOSPHORUS: Phosphorus: 3.7 mg/dL (ref 2.5–4.6)

## 2020-08-16 LAB — MAGNESIUM: Magnesium: 1.9 mg/dL (ref 1.7–2.4)

## 2020-08-16 LAB — PREPARE RBC (CROSSMATCH)

## 2020-08-16 LAB — OCCULT BLOOD X 1 CARD TO LAB, STOOL: Fecal Occult Bld: NEGATIVE

## 2020-08-16 LAB — VANCOMYCIN, TROUGH: Vancomycin Tr: 8 ug/mL — ABNORMAL LOW (ref 15–20)

## 2020-08-16 LAB — ABO/RH: ABO/RH(D): A POS

## 2020-08-17 ENCOUNTER — Other Ambulatory Visit (HOSPITAL_COMMUNITY): Payer: No Typology Code available for payment source

## 2020-08-17 DIAGNOSIS — N170 Acute kidney failure with tubular necrosis: Secondary | ICD-10-CM | POA: Diagnosis not present

## 2020-08-17 DIAGNOSIS — F1011 Alcohol abuse, in remission: Secondary | ICD-10-CM | POA: Diagnosis not present

## 2020-08-17 DIAGNOSIS — J9621 Acute and chronic respiratory failure with hypoxia: Secondary | ICD-10-CM | POA: Diagnosis not present

## 2020-08-17 DIAGNOSIS — G4733 Obstructive sleep apnea (adult) (pediatric): Secondary | ICD-10-CM | POA: Diagnosis not present

## 2020-08-17 LAB — TYPE AND SCREEN
ABO/RH(D): A POS
Antibody Screen: NEGATIVE
Unit division: 0

## 2020-08-17 LAB — CBC
HCT: 28.5 % — ABNORMAL LOW (ref 39.0–52.0)
Hemoglobin: 8.2 g/dL — ABNORMAL LOW (ref 13.0–17.0)
MCH: 27.2 pg (ref 26.0–34.0)
MCHC: 28.8 g/dL — ABNORMAL LOW (ref 30.0–36.0)
MCV: 94.4 fL (ref 80.0–100.0)
Platelets: 532 10*3/uL — ABNORMAL HIGH (ref 150–400)
RBC: 3.02 MIL/uL — ABNORMAL LOW (ref 4.22–5.81)
RDW: 18.8 % — ABNORMAL HIGH (ref 11.5–15.5)
WBC: 14.7 10*3/uL — ABNORMAL HIGH (ref 4.0–10.5)
nRBC: 1.2 % — ABNORMAL HIGH (ref 0.0–0.2)

## 2020-08-17 LAB — BASIC METABOLIC PANEL
Anion gap: 7 (ref 5–15)
BUN: 42 mg/dL — ABNORMAL HIGH (ref 6–20)
CO2: 22 mmol/L (ref 22–32)
Calcium: 10.1 mg/dL (ref 8.9–10.3)
Chloride: 113 mmol/L — ABNORMAL HIGH (ref 98–111)
Creatinine, Ser: 0.53 mg/dL — ABNORMAL LOW (ref 0.61–1.24)
GFR, Estimated: >60 mL/min (ref >60)
Glucose, Bld: 302 mg/dL — ABNORMAL HIGH (ref 70–99)
Potassium: 4.3 mmol/L (ref 3.5–5.1)
Sodium: 142 mmol/L (ref 135–145)

## 2020-08-17 LAB — MAGNESIUM: Magnesium: 1.8 mg/dL (ref 1.7–2.4)

## 2020-08-17 LAB — BPAM RBC
Blood Product Expiration Date: 202205282359
ISSUE DATE / TIME: 202205141346
Unit Type and Rh: 6200

## 2020-08-17 LAB — BLOOD GAS, ARTERIAL
Acid-base deficit: 1.8 mmol/L (ref 0.0–2.0)
Bicarbonate: 23.6 mmol/L (ref 20.0–28.0)
Drawn by: 164
FIO2: 70
O2 Saturation: 91.4 %
Patient temperature: 37
pCO2 arterial: 48 mmHg (ref 32.0–48.0)
pH, Arterial: 7.312 — ABNORMAL LOW (ref 7.350–7.450)
pO2, Arterial: 63.7 mmHg — ABNORMAL LOW (ref 83.0–108.0)

## 2020-08-17 LAB — PHOSPHORUS: Phosphorus: 4.2 mg/dL (ref 2.5–4.6)

## 2020-08-17 NOTE — Progress Notes (Signed)
Pulmonary Critical Care Medicine [facility] [place]   PULMONARY CRITICAL CARE SERVICE  PROGRESS NOTE     Marco Raper  SJG:[id number]  DOB: February 12, 1971   DOA: 07/14/2020  Referring Physician: Carron Curie, MD  HPI: Marke Goodwyn is a 50 y.o. male seen for follow up of Acute on Chronic Respiratory Failure.  Patient is currently 8 on full support on pressure control mode the saturations were 70% right now  Medications: Reviewed on Rounds  Physical Exam:  Vitals: Temperature is 97.1 pulse 118 respiratory 13 blood pressure is 101/61 saturations 100%  Ventilator Settings on assist control FiO2 70% tidal volume 500 PEEP of 10  . General: Comfortable at this time . Eyes: Grossly normal lids, irises & conjunctiva . ENT: grossly tongue is normal . Neck: no obvious mass . Cardiovascular: S1 S2 normal no gallop . Respiratory: No rhonchi no rales are noted . Abdomen: soft . Skin: no rash seen on limited exam . Musculoskeletal: not rigid . Psychiatric:unable to assess . Neurologic: no seizure no involuntary movements         Lab Data:   Basic Metabolic Panel: Recent Labs  Lab 08/13/20 0430 08/14/20 0540 08/15/20 0352 08/16/20 0435 08/17/20 0347  NA 145 146* 144 146* 142  K 4.8 5.5* 4.6 4.5 4.3  CL 114* 116* 112* 114* 113*  CO2 24 26 24 24 22   GLUCOSE 191* 286* 310* 276* 302*  BUN 50* 53* 57* 52* 42*  CREATININE 0.91 0.95 0.74 0.72 0.53*  CALCIUM 9.0 9.3 9.1 9.5 10.1  MG 2.3 1.9 1.8 1.9 1.8  PHOS 4.3 4.1 3.2 3.7 4.2    ABG: Recent Labs  Lab 08/16/20 2051 08/17/20 0922  PHART 7.278* 7.312*  PCO2ART 51.4* 48.0  PO2ART 60.0* 63.7*  HCO3 23.6 23.6  O2SAT 89.7 91.4    Liver Function Tests: Recent Labs  Lab 08/11/20 0910 08/14/20 0540 08/15/20 0352  ALBUMIN 1.9* 1.9* 1.7*   No results for input(s): LIPASE, AMYLASE in the last 168 hours. No results for input(s): AMMONIA in the last 168 hours.  CBC: Recent Labs  Lab  08/13/20 0430 08/14/20 0540 08/15/20 0352 08/16/20 0435 08/17/20 0347  WBC 18.3* 19.2* 13.4* 13.3* 14.7*  HGB 7.5* 7.2* 7.5* 6.6* 8.2*  HCT 27.1* 26.1* 26.5* 23.3* 28.5*  MCV 99.3 99.6 97.8 97.5 94.4  PLT 441* 466* 440* 490* 532*    Cardiac Enzymes: No results for input(s): CKTOTAL, CKMB, CKMBINDEX, TROPONINI in the last 168 hours.  BNP (last 3 results) No results for input(s): BNP in the last 8760 hours.  ProBNP (last 3 results) No results for input(s): PROBNP in the last 8760 hours.  Radiological Exams: DG CHEST PORT 1 VIEW  Result Date: 08/17/2020 CLINICAL DATA:  Shortness of breath and wheezing. EXAM: PORTABLE CHEST 1 VIEW COMPARISON:  08/13/2020 FINDINGS: Tracheostomy tube tip is stable above the carina. Mild diffuse interstitial edema, increased. Unchanged right pleural effusion. New airspace disease within the left lower lung zone identified. IMPRESSION: 1. Progressive congestive heart failure 2. New airspace disease within the left lower lung zone. Electronically Signed   By: 10/13/2020 M.D.   On: 08/17/2020 07:14    Assessment/Plan Active Problems:   Acute on chronic respiratory failure with hypoxia (HCC)   Severe sepsis (HCC)   Obstructive sleep apnea   History of alcohol abuse   Acute renal failure due to tubular necrosis (HCC)   1. Acute on chronic respiratory failure with hypoxia we will continue with full support  on pressure control mode doing well 2. Severe sepsis has been treated we will continue to follow along closely. 3. History of alcohol abuse no change we will continue to monitor closely. 4. Obstructive sleep apnea nonissue at this time.   I have personally seen and evaluated the patient, evaluated laboratory and imaging results, formulated the assessment and plan and placed orders. The Patient requires high complexity decision making with multiple systems involvement.  Rounds were done with the Respiratory Therapy Director and Staff therapists  and discussed with nursing staff also.  Yevonne Pax, MD [facility] Pulmonary Critical Care Medicine Sleep Medicine

## 2020-08-18 DIAGNOSIS — G4733 Obstructive sleep apnea (adult) (pediatric): Secondary | ICD-10-CM | POA: Diagnosis not present

## 2020-08-18 DIAGNOSIS — F1011 Alcohol abuse, in remission: Secondary | ICD-10-CM | POA: Diagnosis not present

## 2020-08-18 DIAGNOSIS — N170 Acute kidney failure with tubular necrosis: Secondary | ICD-10-CM | POA: Diagnosis not present

## 2020-08-18 DIAGNOSIS — J9621 Acute and chronic respiratory failure with hypoxia: Secondary | ICD-10-CM | POA: Diagnosis not present

## 2020-08-18 LAB — VANCOMYCIN, TROUGH: Vancomycin Tr: 11 ug/mL — ABNORMAL LOW (ref 15–20)

## 2020-08-18 LAB — ECHOCARDIOGRAM COMPLETE: S' Lateral: 3.4 cm

## 2020-08-18 NOTE — Progress Notes (Signed)
Pulmonary Critical Care Medicine [facility] [place]   PULMONARY CRITICAL CARE SERVICE  PROGRESS NOTE     Alex Rojas  WER:[id number]  DOB: February 21, 1971   DOA: 07/14/2020  Referring Physician: Carron Curie, MD  HPI: Alex Rojas is a 50 y.o. Rojas seen for follow up of Acute on Chronic Respiratory Failure.  Right now is on the ventilator and full support.  His oxygen requirements still have not improved remains on 75% FiO2  Medications: Reviewed on Rounds  Physical Exam:  Vitals: Temperature is 97.8 pulse 107 respiratory rate is 32 blood pressure 164/80 saturations 100%  Ventilator Settings assist control FiO2 of 75% PEEP of 10 tidal volume 500  . General: Comfortable at this time . Eyes: Grossly normal lids, irises & conjunctiva . ENT: grossly tongue is normal . Neck: no obvious mass . Cardiovascular: S1 S2 normal no gallop . Respiratory: No rhonchi very coarse breath sounds . Abdomen: soft . Skin: no rash seen on limited exam . Musculoskeletal: not rigid . Psychiatric:unable to assess . Neurologic: no seizure no involuntary movements         Lab Data:   Basic Metabolic Panel: Recent Labs  Lab 08/13/20 0430 08/14/20 0540 08/15/20 0352 08/16/20 0435 08/17/20 0347  NA 145 146* 144 146* 142  K 4.8 5.5* 4.6 4.5 4.3  CL 114* 116* 112* 114* 113*  CO2 24 26 24 24 22   GLUCOSE 191* 286* 310* 276* 302*  BUN 50* 53* 57* 52* 42*  CREATININE 0.91 0.95 0.74 0.72 0.53*  CALCIUM 9.0 9.3 9.1 9.5 10.1  MG 2.3 1.9 1.8 1.9 1.8  PHOS 4.3 4.1 3.2 3.7 4.2    ABG: Recent Labs  Lab 08/16/20 2051 08/17/20 0922  PHART 7.278* 7.312*  PCO2ART 51.4* 48.0  PO2ART 60.0* 63.7*  HCO3 23.6 23.6  O2SAT 89.7 91.4    Liver Function Tests: Recent Labs  Lab 08/14/20 0540 08/15/20 0352  ALBUMIN 1.9* 1.7*   No results for input(s): LIPASE, AMYLASE in the last 168 hours. No results for input(s): AMMONIA in the last 168 hours.  CBC: Recent Labs  Lab  08/13/20 0430 08/14/20 0540 08/15/20 0352 08/16/20 0435 08/17/20 0347  WBC 18.3* 19.2* 13.4* 13.3* 14.7*  HGB 7.5* 7.2* 7.5* 6.6* 8.2*  HCT 27.1* 26.1* 26.5* 23.3* 28.5*  MCV 99.3 99.6 97.8 97.5 94.4  PLT 441* 466* 440* 490* 532*    Cardiac Enzymes: No results for input(s): CKTOTAL, CKMB, CKMBINDEX, TROPONINI in the last 168 hours.  BNP (last 3 results) No results for input(s): BNP in the last 8760 hours.  ProBNP (last 3 results) No results for input(s): PROBNP in the last 8760 hours.  Radiological Exams: DG CHEST PORT 1 VIEW  Result Date: 08/17/2020 CLINICAL DATA:  Shortness of breath and wheezing. EXAM: PORTABLE CHEST 1 VIEW COMPARISON:  08/13/2020 FINDINGS: Tracheostomy tube tip is stable above the carina. Mild diffuse interstitial edema, increased. Unchanged right pleural effusion. New airspace disease within the left lower lung zone identified. IMPRESSION: 1. Progressive congestive heart failure 2. New airspace disease within the left lower lung zone. Electronically Signed   By: 10/13/2020 M.D.   On: 08/17/2020 07:14    Assessment/Plan Active Problems:   Acute on chronic respiratory failure with hypoxia (HCC)   Severe sepsis (HCC)   Obstructive sleep apnea   History of alcohol abuse   Acute renal failure due to tubular necrosis (HCC)   1. Acute on chronic respiratory failure with hypoxia we will continue with  full support on the ventilator on assist control mode currently requiring 75% FiO2.  Continue pulmonary toilet and supportive care. 2. Severe sepsis treated in resolution 3. Obstructive sleep apnea patient is at baseline 4. Alcohol abuse no change 5. Acute renal failure for following patient's lab work closely   I have personally seen and evaluated the patient, evaluated laboratory and imaging results, formulated the assessment and plan and placed orders. The Patient requires high complexity decision making with multiple systems involvement.  Rounds were  done with the Respiratory Therapy Director and Staff therapists and discussed with nursing staff also.  Alex Pax, MD [facility] Pulmonary Critical Care Medicine Sleep Medicine

## 2020-08-19 ENCOUNTER — Other Ambulatory Visit (HOSPITAL_COMMUNITY): Payer: No Typology Code available for payment source

## 2020-08-19 DIAGNOSIS — F1011 Alcohol abuse, in remission: Secondary | ICD-10-CM | POA: Diagnosis not present

## 2020-08-19 DIAGNOSIS — N170 Acute kidney failure with tubular necrosis: Secondary | ICD-10-CM | POA: Diagnosis not present

## 2020-08-19 DIAGNOSIS — G4733 Obstructive sleep apnea (adult) (pediatric): Secondary | ICD-10-CM | POA: Diagnosis not present

## 2020-08-19 DIAGNOSIS — J9621 Acute and chronic respiratory failure with hypoxia: Secondary | ICD-10-CM | POA: Diagnosis not present

## 2020-08-19 LAB — BASIC METABOLIC PANEL
Anion gap: 7 (ref 5–15)
BUN: 38 mg/dL — ABNORMAL HIGH (ref 6–20)
CO2: 23 mmol/L (ref 22–32)
Calcium: 10.3 mg/dL (ref 8.9–10.3)
Chloride: 109 mmol/L (ref 98–111)
Creatinine, Ser: 0.54 mg/dL — ABNORMAL LOW (ref 0.61–1.24)
GFR, Estimated: >60 mL/min (ref >60)
Glucose, Bld: 175 mg/dL — ABNORMAL HIGH (ref 70–99)
Potassium: 4.4 mmol/L (ref 3.5–5.1)
Sodium: 139 mmol/L (ref 135–145)

## 2020-08-19 LAB — CBC
HCT: 28.4 % — ABNORMAL LOW (ref 39.0–52.0)
Hemoglobin: 8.3 g/dL — ABNORMAL LOW (ref 13.0–17.0)
MCH: 28 pg (ref 26.0–34.0)
MCHC: 29.2 g/dL — ABNORMAL LOW (ref 30.0–36.0)
MCV: 95.9 fL (ref 80.0–100.0)
Platelets: 495 10*3/uL — ABNORMAL HIGH (ref 150–400)
RBC: 2.96 MIL/uL — ABNORMAL LOW (ref 4.22–5.81)
RDW: 18.6 % — ABNORMAL HIGH (ref 11.5–15.5)
WBC: 14.3 10*3/uL — ABNORMAL HIGH (ref 4.0–10.5)
nRBC: 0.9 % — ABNORMAL HIGH (ref 0.0–0.2)

## 2020-08-19 LAB — MAGNESIUM: Magnesium: 1.7 mg/dL (ref 1.7–2.4)

## 2020-08-19 LAB — MISC LABCORP TEST (SEND OUT): Labcorp test code: 164226

## 2020-08-19 LAB — CULTURE, BLOOD (ROUTINE X 2)
Culture: NO GROWTH
Culture: NO GROWTH

## 2020-08-19 LAB — B. BURGDORFI ANTIBODIES

## 2020-08-19 LAB — VANCOMYCIN, TROUGH: Vancomycin Tr: 24 ug/mL (ref 15–20)

## 2020-08-19 NOTE — Progress Notes (Signed)
Pulmonary Critical Care Medicine [facility] [place]   PULMONARY CRITICAL CARE SERVICE  PROGRESS NOTE     Alex Rojas  IHW:[id number]  DOB: [birth year]   DOA: 07/14/2020  Referring Physician: Carron Curie, MD  HPI: Alex Rojas is a 50 y.o. male seen for follow up of Acute on Chronic Respiratory Failure.  Patient is on the ventilator still requiring high FiO2 right now is on 75% FiO2  Medications: Reviewed on Rounds  Physical Exam:  Vitals: Temperature is 97.7 pulse 94 respiratory 23 blood pressure is 100/82 saturations 96%  Ventilator Settings on assist control FiO2 75% tidal volume 500 PEEP 5  . General: Comfortable at this time . Eyes: Grossly normal lids, irises & conjunctiva . ENT: grossly tongue is normal . Neck: no obvious mass . Cardiovascular: S1 S2 normal no gallop . Respiratory: Coarse breath sounds with few scattered rhonchi . Abdomen: soft . Skin: no rash seen on limited exam . Musculoskeletal: not rigid . Psychiatric:unable to assess . Neurologic: no seizure no involuntary movements         Lab Data:   Basic Metabolic Panel: Recent Labs  Lab 08/13/20 0430 08/14/20 0540 08/15/20 0352 08/16/20 0435 08/17/20 0347 08/19/20 0427  NA 145 146* 144 146* 142 139  K 4.8 5.5* 4.6 4.5 4.3 4.4  CL 114* 116* 112* 114* 113* 109  CO2 24 26 24 24 22 23   GLUCOSE 191* 286* 310* 276* 302* 175*  BUN 50* 53* 57* 52* 42* 38*  CREATININE 0.91 0.95 0.74 0.72 0.53* 0.54*  CALCIUM 9.0 9.3 9.1 9.5 10.1 10.3  MG 2.3 1.9 1.8 1.9 1.8 1.7  PHOS 4.3 4.1 3.2 3.7 4.2  --     ABG: Recent Labs  Lab 08/16/20 2051 08/17/20 0922  PHART 7.278* 7.312*  PCO2ART 51.4* 48.0  PO2ART 60.0* 63.7*  HCO3 23.6 23.6  O2SAT 89.7 91.4    Liver Function Tests: Recent Labs  Lab 08/14/20 0540 08/15/20 0352  ALBUMIN 1.9* 1.7*   No results for input(s): LIPASE, AMYLASE in the last 168 hours. No results for input(s): AMMONIA in the last 168  hours.  CBC: Recent Labs  Lab 08/14/20 0540 08/15/20 0352 08/16/20 0435 08/17/20 0347 08/19/20 0427  WBC 19.2* 13.4* 13.3* 14.7* 14.3*  HGB 7.2* 7.5* 6.6* 8.2* 8.3*  HCT 26.1* 26.5* 23.3* 28.5* 28.4*  MCV 99.6 97.8 97.5 94.4 95.9  PLT 466* 440* 490* 532* 495*    Cardiac Enzymes: No results for input(s): CKTOTAL, CKMB, CKMBINDEX, TROPONINI in the last 168 hours.  BNP (last 3 results) No results for input(s): BNP in the last 8760 hours.  ProBNP (last 3 results) No results for input(s): PROBNP in the last 8760 hours.  Radiological Exams: No results found.  Assessment/Plan Active Problems:   Acute on chronic respiratory failure with hypoxia (HCC)   Severe sepsis (HCC)   Obstructive sleep apnea   History of alcohol abuse   Acute renal failure due to tubular necrosis (HCC)   1. Acute on chronic respiratory failure hypoxia the patient is on full support right now still requiring 75% FiO2 the last chest x-ray had shown progressive congestive heart failure also some airspace disease.  I have suggested that we do a CT scan of the chest to further evaluate the right ventricular function was normal the left ventricular function was 60 to 65% and there was some grade 1 diastolic dysfunction we will discuss further with the primary care team regarding possibility of diuresing 2. Severe sepsis  treated 3. Obstructive sleep apnea no change right now is on the ventilator. 4. Alcohol abuse no change 5. Acute renal failure we will continue to follow along closely   I have personally seen and evaluated the patient, evaluated laboratory and imaging results, formulated the assessment and plan and placed orders. The Patient requires high complexity decision making with multiple systems involvement.  Rounds were done with the Respiratory Therapy Director and Staff therapists and discussed with nursing staff also.  Yevonne Pax, MD [facility] Pulmonary Critical Care Medicine Sleep Medicine

## 2020-08-19 NOTE — Consult Note (Signed)
Referring Physician: [name] is an 50 y.o. male.                       Chief Complaint: Congestive heart failure  HPI: 50 years old white male has acute on chronic respiratory failure, severe sepsis, diastolic left heart failure, Sleep apnea, DKA and metabolic encephalopathy. He failed extubation few times and now has tracheostomy and ventilator dependence. His Chest  X-ray shows worsening heart failure/pulmonary edema. Echocardiogram shows normal LV systolic function.   Past Medical History:  Diagnosis Date  . Acute on chronic respiratory failure with hypoxia (HCC)   . Acute renal failure due to tubular necrosis (HCC)   . History of alcohol abuse   . Obstructive sleep apnea   . Severe sepsis [facility])     PAST MEDICAL HISTORY Past Medical History:  Diagnosis Date  . Anxiety  . Diabetes mellitus (HCC)  . Knee pain  left  . Low back pain    PAST SURGICAL HISTORY Past Surgical History:  Procedure Laterality Date  . HX CHOLECYSTECTOMY  . HX EYE SURGERY  as an infant   ALLERGIES No Known Allergies  FAMILY HISTORY Family History  Problem Relation Name Age of Onset  . Unknown Father  . Unknown Mother   SOCIAL HISTORY Social History   Tobacco Use  . Smoking status: Current Every Day Smoker  Packs/day: 0.50  Years: 20.00  Pack years: 10.00  . Smokeless tobacco: Never Used  . Tobacco comment: Pt encouraged to contact PCP when ready to quit  Substance Use Topics  . Alcohol use: Yes  Comment: one glass of wine every other week  . Drug use: Not Currently  Types: Marijuana  Comment: last use 2018  No family history on file. Social History:  has no history on file for tobacco use, alcohol use, and drug use.  Medications prior to admission.  See med list on chart.  Results for orders placed or performed during the hospital encounter of 07/14/20 (from the past 48 hour(s))  Vancomycin, trough     Status: Abnormal   Collection Time: 08/18/20   9:38 AM  Result Value Ref Range   Vancomycin Tr 11 (L) 15 - 20 ug/mL    Comment: Performed at [facility] Lab, [address], [place], [place] [address]  CBC     Status: Abnormal   Collection Time: 08/19/20  4:27 AM  Result Value Ref Range   WBC 14.3 (H) 4.0 - 10.5 K/uL   RBC 2.96 (L) 4.22 - 5.81 MIL/uL   Hemoglobin 8.3 (L) 13.0 - 17.0 g/dL   HCT 02.5 (L) 42.7 - 06.2 %   MCV 95.9 80.0 - 100.0 fL   MCH 28.0 26.0 - 34.0 pg   MCHC 29.2 (L) 30.0 - 36.0 g/dL   RDW 37.6 (H) 28.3 - 15.1 %   Platelets 495 (H) 150 - 400 K/uL   nRBC 0.9 (H) 0.0 - 0.2 %    Comment: Performed at [facility], [address], [place], [place] [address]  Basic metabolic panel     Status: Abnormal   Collection Time: 08/19/20  4:27 AM  Result Value Ref Range   Sodium 139 135 - 145 mmol/L   Potassium 4.4 3.5 - 5.1 mmol/L   Chloride 109 98 - 111 mmol/L   CO2 23 22 - 32 mmol/L   Glucose, Bld 175 (H) 70 - 99 mg/dL    Comment: Glucose  reference range applies only to samples taken after fasting for at least 8 hours.   BUN 38 (H) 6 - 20 mg/dL   Creatinine, Ser 2.24 (L) 0.61 - 1.24 mg/dL   Calcium 82.5 8.9 - 00.3 mg/dL   GFR, Estimated >70 >48 mL/min    Comment: (NOTE) Calculated using the CKD-EPI Creatinine Equation (2021)    Anion gap 7 5 - 15    Comment: Performed at [facility], [address], [place], [place] [address]  Magnesium     Status: None   Collection Time: 08/19/20  4:27 AM  Result Value Ref Range   Magnesium 1.7 1.7 - 2.4 mg/dL    Comment: Performed at [facility], [address] [place], [place] [address]   No results found.  Review Of Systems Constitutional: Positive fever, chills, weight loss or gain. Eyes: No vision change, No discharge. Ears: No hearing loss, No tinnitus. Respiratory: No asthma, COPD, positive pneumonias, shortness of breath. No hemoptysis. Cardiovascular: No chest pain, palpitation, positive leg edema. Gastrointestinal: No nausea, vomiting,  diarrhea, constipation. No GI bleed. No hepatitis. Genitourinary: No dysuria, hematuria, kidney stone. No incontinance. Neurological: No headache, stroke, seizures.  Psychiatry: No psych facility admission for anxiety, depression, suicide. No detox. Skin: No rash. Musculoskeletal: No joint pain, fibromyalgia. No neck pain, back pain. Lymphadenopathy: Positive lymphadenopathy. Hematology: Positive anemia or easy bruising.   There were no vitals taken for this visit. There is no height or weight on file to calculate BMI. General appearance: alert, cooperative, appears stated age and moderate respiratory distress at rest. Head: Normocephalic, atraumatic. Eyes: Blue eyes, Pale conjunctiva, corneas clear.  Neck: No adenopathy, no carotid bruit, no JVD, supple, symmetrical, tracheostomy in place. Resp: Clearing to auscultation bilaterally. Cardio: Regular rate and rhythm, S1, S2 normal, II/VI systolic murmur, no click, rub or gallop GI: Soft, non-tender; bowel sounds normal; no organomegaly. Extremities: 2 + edema, cyanosis or clubbing. Skin: Warm and dry.  Neurologic: Alert and oriented X 0.  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 Consider one dose albumin to enhance diuresis. Check iron level. Check lipid panel. Add losartan. Add small dose carvedilol  Time spent: Review of old records, Lab, x-rays, EKG, other cardiac tests, examination, discussion with patient/Nurse/PA over 70 minutes.  Ricki Rodriguez, MD  08/19/2020, 1:28 PM

## 2020-08-20 ENCOUNTER — Inpatient Hospital Stay (HOSPITAL_COMMUNITY): Payer: No Typology Code available for payment source

## 2020-08-20 ENCOUNTER — Inpatient Hospital Stay (HOSPITAL_COMMUNITY)
Admission: AD | Admit: 2020-08-20 | Discharge: 2020-08-27 | DRG: 207 | Disposition: A | Discharge status: Other Acute Inpatient Hospital | Payer: No Typology Code available for payment source | Source: Other Acute Inpatient Hospital | Attending: Emergency Medicine | Admitting: Emergency Medicine

## 2020-08-20 DIAGNOSIS — D509 Iron deficiency anemia, unspecified: Secondary | ICD-10-CM | POA: Diagnosis present

## 2020-08-20 DIAGNOSIS — I5031 Acute diastolic (congestive) heart failure: Secondary | ICD-10-CM

## 2020-08-20 DIAGNOSIS — N170 Acute kidney failure with tubular necrosis: Secondary | ICD-10-CM | POA: Diagnosis not present

## 2020-08-20 DIAGNOSIS — Z87891 Personal history of nicotine dependence: Secondary | ICD-10-CM

## 2020-08-20 DIAGNOSIS — J9622 Acute and chronic respiratory failure with hypercapnia: Secondary | ICD-10-CM

## 2020-08-20 DIAGNOSIS — E872 Acidosis: Secondary | ICD-10-CM | POA: Diagnosis present

## 2020-08-20 DIAGNOSIS — E870 Hyperosmolality and hypernatremia: Secondary | ICD-10-CM | POA: Diagnosis present

## 2020-08-20 DIAGNOSIS — J9611 Chronic respiratory failure with hypoxia: Secondary | ICD-10-CM

## 2020-08-20 DIAGNOSIS — J9811 Atelectasis: Secondary | ICD-10-CM | POA: Diagnosis present

## 2020-08-20 DIAGNOSIS — G9341 Metabolic encephalopathy: Secondary | ICD-10-CM | POA: Diagnosis present

## 2020-08-20 DIAGNOSIS — Z1624 Resistance to multiple antibiotics: Secondary | ICD-10-CM | POA: Diagnosis present

## 2020-08-20 DIAGNOSIS — I5033 Acute on chronic diastolic (congestive) heart failure: Secondary | ICD-10-CM | POA: Diagnosis present

## 2020-08-20 DIAGNOSIS — J9621 Acute and chronic respiratory failure with hypoxia: Secondary | ICD-10-CM | POA: Diagnosis present

## 2020-08-20 DIAGNOSIS — R400 Somnolence: Secondary | ICD-10-CM | POA: Diagnosis not present

## 2020-08-20 DIAGNOSIS — I110 Hypertensive heart disease with heart failure: Secondary | ICD-10-CM | POA: Diagnosis present

## 2020-08-20 DIAGNOSIS — R140 Abdominal distension (gaseous): Secondary | ICD-10-CM

## 2020-08-20 DIAGNOSIS — Z6841 Body Mass Index (BMI) 40.0 and over, adult: Secondary | ICD-10-CM

## 2020-08-20 DIAGNOSIS — F1011 Alcohol abuse, in remission: Secondary | ICD-10-CM | POA: Diagnosis not present

## 2020-08-20 DIAGNOSIS — E1165 Type 2 diabetes mellitus with hyperglycemia: Secondary | ICD-10-CM | POA: Diagnosis present

## 2020-08-20 DIAGNOSIS — R4182 Altered mental status, unspecified: Secondary | ICD-10-CM | POA: Diagnosis present

## 2020-08-20 DIAGNOSIS — J151 Pneumonia due to Pseudomonas: Principal | ICD-10-CM

## 2020-08-20 DIAGNOSIS — J690 Pneumonitis due to inhalation of food and vomit: Secondary | ICD-10-CM

## 2020-08-20 DIAGNOSIS — Z930 Tracheostomy status: Secondary | ICD-10-CM | POA: Diagnosis not present

## 2020-08-20 DIAGNOSIS — Z9911 Dependence on respirator [ventilator] status: Secondary | ICD-10-CM

## 2020-08-20 DIAGNOSIS — E669 Obesity, unspecified: Secondary | ICD-10-CM | POA: Diagnosis present

## 2020-08-20 LAB — BLOOD GAS, ARTERIAL
Acid-Base Excess: 2.1 mmol/L — ABNORMAL HIGH (ref 0.0–2.0)
Bicarbonate: 27.5 mmol/L (ref 20.0–28.0)
Drawn by: 60057
FIO2: 100
O2 Saturation: 90.5 %
Patient temperature: 37.4
pCO2 arterial: 54.5 mmHg — ABNORMAL HIGH (ref 32.0–48.0)
pH, Arterial: 7.325 — ABNORMAL LOW (ref 7.350–7.450)
pO2, Arterial: 66 mmHg — ABNORMAL LOW (ref 83.0–108.0)

## 2020-08-20 LAB — LIPID PANEL
Cholesterol: 169 mg/dL (ref 0–200)
HDL: 31 mg/dL — ABNORMAL LOW (ref >40)
LDL Cholesterol: 74 mg/dL (ref 0–99)
Total CHOL/HDL Ratio: 5.5 RATIO
Triglycerides: 322 mg/dL — ABNORMAL HIGH (ref <150)
VLDL: 64 mg/dL — ABNORMAL HIGH (ref 0–40)

## 2020-08-20 LAB — IRON AND TIBC
Iron: 34 ug/dL — ABNORMAL LOW (ref 45–182)
Saturation Ratios: 13 % — ABNORMAL LOW (ref 17.9–39.5)
TIBC: 266 ug/dL (ref 250–450)
UIBC: 232 ug/dL

## 2020-08-20 LAB — GLUCOSE, CAPILLARY: Glucose-Capillary: 226 mg/dL — ABNORMAL HIGH (ref 70–99)

## 2020-08-20 LAB — CBC
HCT: 25.7 % — ABNORMAL LOW (ref 39.0–52.0)
Hemoglobin: 7.5 g/dL — ABNORMAL LOW (ref 13.0–17.0)
MCH: 28 pg (ref 26.0–34.0)
MCHC: 29.2 g/dL — ABNORMAL LOW (ref 30.0–36.0)
MCV: 95.9 fL (ref 80.0–100.0)
Platelets: 339 10*3/uL (ref 150–400)
RBC: 2.68 MIL/uL — ABNORMAL LOW (ref 4.22–5.81)
RDW: 19.7 % — ABNORMAL HIGH (ref 11.5–15.5)
WBC: 13.8 10*3/uL — ABNORMAL HIGH (ref 4.0–10.5)
nRBC: 0.7 % — ABNORMAL HIGH (ref 0.0–0.2)

## 2020-08-20 LAB — CREATININE, SERUM
Creatinine, Ser: 0.66 mg/dL (ref 0.61–1.24)
GFR, Estimated: >60 mL/min (ref >60)

## 2020-08-20 LAB — FERRITIN: Ferritin: 308 ng/mL (ref 24–336)

## 2020-08-20 LAB — SARS CORONAVIRUS 2 BY RT PCR (HOSPITAL ORDER, PERFORMED IN ~~LOC~~ HOSPITAL LAB): SARS Coronavirus 2: NEGATIVE

## 2020-08-20 LAB — VANCOMYCIN, TROUGH: Vancomycin Tr: 16 ug/mL (ref 15–20)

## 2020-08-20 LAB — MAGNESIUM: Magnesium: 2.2 mg/dL (ref 1.7–2.4)

## 2020-08-20 MED ORDER — FUROSEMIDE 10 MG/ML IJ SOLN: 40.0000 mg | Freq: Once | INTRAMUSCULAR | Status: DC

## 2020-08-20 MED ORDER — POLYETHYLENE GLYCOL 3350 17 G PO PACK: 17.0000 g | PACK | Freq: Every day | ORAL | Status: DC | PRN

## 2020-08-20 MED ORDER — DOCUSATE SODIUM 100 MG PO CAPS: 100.0000 mg | ORAL_CAPSULE | Freq: Two times a day (BID) | ORAL | Status: DC | PRN

## 2020-08-20 MED ORDER — FUROSEMIDE 10 MG/ML IJ SOLN
40.0000 mg | Freq: Two times a day (BID) | INTRAMUSCULAR | Status: DC
  Administered 2020-08-20 – 2020-08-21 (×2): 40 mg via INTRAVENOUS
  Filled 2020-08-20 (×2): qty 4

## 2020-08-20 MED ORDER — INSULIN ASPART 100 UNIT/ML IJ SOLN
0.0000 [IU] | INTRAMUSCULAR | Status: DC
  Administered 2020-08-21: 3 [IU] via SUBCUTANEOUS
  Administered 2020-08-21: 8 [IU] via SUBCUTANEOUS
  Administered 2020-08-21 (×2): 3 [IU] via SUBCUTANEOUS
  Administered 2020-08-21 (×2): 5 [IU] via SUBCUTANEOUS
  Administered 2020-08-21: 3 [IU] via SUBCUTANEOUS
  Administered 2020-08-22 (×3): 5 [IU] via SUBCUTANEOUS
  Administered 2020-08-22 (×3): 3 [IU] via SUBCUTANEOUS
  Administered 2020-08-23: 5 [IU] via SUBCUTANEOUS
  Administered 2020-08-23 – 2020-08-24 (×5): 3 [IU] via SUBCUTANEOUS
  Administered 2020-08-24: 2 [IU] via SUBCUTANEOUS
  Administered 2020-08-24: 5 [IU] via SUBCUTANEOUS

## 2020-08-20 MED ORDER — SODIUM CHLORIDE 0.9 % IV SOLN
2.0000 g | Freq: Three times a day (TID) | INTRAVENOUS | Status: DC
  Administered 2020-08-21 (×2): 2 g via INTRAVENOUS
  Filled 2020-08-20 (×2): qty 2

## 2020-08-20 MED ORDER — ENOXAPARIN SODIUM 40 MG/0.4ML IJ SOSY
40.0000 mg | PREFILLED_SYRINGE | INTRAMUSCULAR | Status: DC
  Administered 2020-08-20 – 2020-08-21 (×2): 40 mg via SUBCUTANEOUS
  Filled 2020-08-20 (×2): qty 0.4

## 2020-08-20 MED ORDER — PANTOPRAZOLE SODIUM 40 MG IV SOLR
40.0000 mg | INTRAVENOUS | Status: DC
  Administered 2020-08-20: 40 mg via INTRAVENOUS
  Filled 2020-08-20: qty 40

## 2020-08-20 MED ORDER — FUROSEMIDE 10 MG/ML IJ SOLN: 40.0000 mg | Freq: Two times a day (BID) | INTRAMUSCULAR | Status: DC

## 2020-08-20 NOTE — Progress Notes (Signed)
Pulmonary Critical Care Medicine [facility] [place]   PULMONARY CRITICAL CARE SERVICE  PROGRESS NOTE     Alex Rojas  ZHY:[id number]  DOB: [birth year]   DOA: 07/14/2020  Referring Physician: Carron Curie, MD  HPI: Alex Rojas is a 50 y.o. male seen for follow up of Acute on Chronic Respiratory Failure.  Patient continues to have decline in status I spoke to his stepmom today and updated her.  His oxygen requirements have gone up now he is requiring 100% FiO2 he had been on 85% earlier in the morning.  The CT scan that was done shows bilateral effusions along with this patient continues to have fevers.  Discussed with the primary care team possibility of endocarditis should be considered he has been treated with antibiotics and has not defervesced he is also received antiviral therapy and has not defervesced.  At this stage because of ongoing worsening oxygen requirements and a fever that has not been clearly identified would suggest transfer to the ICU  Medications: Reviewed on Rounds  Physical Exam:  Vitals: Temperature 100.5 pulse 120 respiratory rate is 33 blood pressure 141/75 saturations 92%  Ventilator Settings on assist control FiO2 100% tidal volume 500 PEEP 5  . General: Comfortable at this time . Eyes: Grossly normal lids, irises & conjunctiva . ENT: grossly tongue is normal . Neck: no obvious mass . Cardiovascular: S1 S2 normal no gallop . Respiratory: Scattered rhonchi very coarse breath sounds . Abdomen: soft . Skin: no rash seen on limited exam . Musculoskeletal: not rigid . Psychiatric:unable to assess . Neurologic: no seizure no involuntary movements         Lab Data:   Basic Metabolic Panel: Recent Labs  Lab 08/14/20 0540 08/15/20 0352 08/16/20 0435 08/17/20 0347 08/19/20 0427 08/20/20 0627  NA 146* 144 146* 142 139  --   K 5.5* 4.6 4.5 4.3 4.4  --   CL 116* 112* 114* 113* 109  --   CO2 26 24 24 22 23   --   GLUCOSE  286* 310* 276* 302* 175*  --   BUN 53* 57* 52* 42* 38*  --   CREATININE 0.95 0.74 0.72 0.53* 0.54*  --   CALCIUM 9.3 9.1 9.5 10.1 10.3  --   MG 1.9 1.8 1.9 1.8 1.7 2.2  PHOS 4.1 3.2 3.7 4.2  --   --     ABG: Recent Labs  Lab 08/16/20 2051 08/17/20 0922  PHART 7.278* 7.312*  PCO2ART 51.4* 48.0  PO2ART 60.0* 63.7*  HCO3 23.6 23.6  O2SAT 89.7 91.4    Liver Function Tests: Recent Labs  Lab 08/14/20 0540 08/15/20 0352  ALBUMIN 1.9* 1.7*   No results for input(s): LIPASE, AMYLASE in the last 168 hours. No results for input(s): AMMONIA in the last 168 hours.  CBC: Recent Labs  Lab 08/14/20 0540 08/15/20 0352 08/16/20 0435 08/17/20 0347 08/19/20 0427  WBC 19.2* 13.4* 13.3* 14.7* 14.3*  HGB 7.2* 7.5* 6.6* 8.2* 8.3*  HCT 26.1* 26.5* 23.3* 28.5* 28.4*  MCV 99.6 97.8 97.5 94.4 95.9  PLT 466* 440* 490* 532* 495*    Cardiac Enzymes: No results for input(s): CKTOTAL, CKMB, CKMBINDEX, TROPONINI in the last 168 hours.  BNP (last 3 results) No results for input(s): BNP in the last 8760 hours.  ProBNP (last 3 results) No results for input(s): PROBNP in the last 8760 hours.  Radiological Exams: CT CHEST WO CONTRAST  Result Date: 08/19/2020 CLINICAL DATA:  Unresolved pneumonia, mechanical ventilation EXAM:  CT CHEST WITHOUT CONTRAST TECHNIQUE: Multidetector CT imaging of the chest was performed following the standard protocol without IV contrast. COMPARISON:  CT chest, abdomen and pelvis 07/24/2020 FINDINGS: Cardiovascular: Top-normal cardiac size. Trace pericardial fluid, may be physiologic. Left upper extremity PICC tip terminates near the superior cavoatrial junction Atherosclerotic plaque within the normal caliber aorta. Central pulmonary arteries are normal caliber. Luminal assessment precluded in the absence of contrast media. Mediastinum/Nodes: Shotty mediastinal and partially visualized hilar adenopathy AP reactive and/or edematous. No new enlarged or enlarging axillary  adenopathy. Tracheostomy tube place with tip positioned the in the mid to upper trachea 6.2 cm from the carina. Some scattered secretions noted below level of the tracheostomy tube. No acute abnormality of the thoracic esophagus. Thyroid gland and thoracic inlet are unremarkable. Lungs/Pleura: Moderate bilateral pleural effusions with some passive atelectasis. Overall size is increased prior. Underlying airspace disease is difficult assess though suspected given additional areas of multifocal patchy consolidation ground-glass opacity elsewhere throughout lungs. This is addition to further vascular redistribution, septal and fissural thickening ground-glass density suggesting interstitial and likely alveolar edema as well. No pneumothorax. Upper Abdomen: Small volume abdominal ascites. No other acute abnormalities present in the visualized portions of the upper abdomen. Musculoskeletal: No acute osseous abnormality or suspicious osseous lesion. No worrisome chest wall mass or lesion. Mild body wall edema. IMPRESSION: 1. Increasing pleural effusions with further passive atelectasis. 2. Developing multifocal patchy heterogeneous opacities with mixed consolidation and ground-glass. Likely reflecting a combination infectious airspace disease as well as interstitial and alveolar edema given additional features of CHF/volume overload and anasarca. 3. Tracheostomy tip terminates 6.2 cm from the carina. 4. Left PICC tip at the superior cavoatrial junction. 5.  Aortic Atherosclerosis (ICD10-I70.0). Electronically Signed   By: Kreg Shropshire M.D.   On: 08/19/2020 19:08    Assessment/Plan Active Problems:   Acute on chronic respiratory failure with hypoxia (HCC)   Severe sepsis (HCC)   Obstructive sleep apnea   History of alcohol abuse   Acute renal failure due to tubular necrosis (HCC)   1. Acute on chronic respiratory failure with hypoxia plan is going to be to continue with full support on the ventilator.  Patient  now is on 100% FiO2 CT scan results as noted above worsening of effusions he may need thoracentesis in addition however he has groundglass opacities not clear etiology patient may have atypical infection versus blood versus noninfectious process. 2. Severe sepsis patient has had groin infection there is possibility of deep seeding would suggest ongoing antibiotics but I think we may need to consider TEE there is a possibility of drug use I did speak to the mother she was not 100% certain that he did not do IV drugs but he does have a previous history of drug use but not clearly IV drug use that she was aware of. 3. History of alcohol abuse no sign of withdrawal 4. Acute renal failure we will continue to monitor the fluid status closely. 5. OSA nonissue   I have personally seen and evaluated the patient, evaluated laboratory and imaging results, formulated the assessment and plan and placed orders. The Patient requires high complexity decision making with multiple systems involvement.  Rounds were done with the Respiratory Therapy Director and Staff therapists and discussed with nursing staff also.  Yevonne Pax, MD [facility] Pulmonary Critical Care Medicine Sleep Medicine

## 2020-08-20 NOTE — H&P (Addendum)
NAME:  Alex Rojas, MRN:  [id number], DOB:  11/19/1970, LOS: 0 ADMISSION DATE:  08/20/2020, CONSULTATION DATE: 5/18 REFERRING MD:  Dr. Manson Passey ([facility]), CHIEF COMPLAINT:  Refractory Hypoxia  History of Present Illness:  Alex Rojas is a 50 y.o. M who [facility] on 5/17 for refractory hypoxia.  He has a pertinent past medical history OSA, Tracheostomy (2022), Diastolic Left heart failure, DM, DKA, metabolic encephalopathy, sepsis post right groin infection s/p I&D, smoking history   Per chart review, [facility] for [facility] post hospital admission for sepsis from a right groin infection s/p I&D. Prior to his [facility] admission, He was extubated and re-intubated multiple times. He was trached in early April 2022 continued to require mechanical ventilation.     On 5/18 PCCM was consulted for further care after the patients FiO2 requirement had increased from 85% to 100%. CT scan concerning for bilateral ground glass opacities and infiltrates. Per Dr. Manson Passey he has remained persistently febrile through his admission, with [facility] ID following. He was being treated with Vancomycin, Meropenem, and Acyclovir.   Pt arrived to ICU completely unresponsive off sedation, per RN this was part of the reason for transfer.  It is unclear what his baseline has been.  Cardiology note from yesterday notes A&O x0  Pertinent  Medical History  OSA, Tracheostomy (2022), Diastolic Left heart failure, DM, DKA, metabolic encephalopathy, sepsis post right groin infection s/p I&D, smoking history   Significant Hospital Events: Including procedures, antibiotic start and stop dates in addition to other pertinent events   . April 2022-Tracheostomy . 4/11 Admit to [facility] . 5/12 TA- Psuedomonas, ECHO LVEF 60-65% RVSF normal . 5/18- Transfer from [facility]. PCCM consulted. On Vanc/Mero/Acyclovir PTA.   Interim History /  Subjective:  See above  Unable to obtain subjective evaluation due to patient status Objective   Pulse (!) 104, temperature 99.4 F (37.4 C), temperature source Axillary, resp. rate (!) 31, SpO2 94 %.    Vent Mode: PRVC FiO2 (%):  [100 %] 100 % Set Rate:  [22 bmp] 22 bmp Vt Set:  [500 mL] 500 mL PEEP:  [10 cmH20] 10 cmH20 Plateau Pressure:  [28 cmH20] 28 cmH20  No intake or output data in the 24 hours ending 08/20/20 1937 There were no vitals filed for this visit.  Examination: General:  Obese, intubated M, unresponsive  HEENT: MM pink/moist, icteric/anicteric, trach in place, pupils equal and responsive Neuro: unresponsive to pain on exam, though RN noted spontaneous eye opening earlier, triggering breaths on vent, + gag and minimal corneals, pupils responsive to light CV: S1S2, rrr, no m/r/g appreciated PULM:  Bronchial breath sounds bilateral bases with scattered rhonchi in upper lobes, requiring PRVC with 100% FiO2 and PEEP 10 to maintain sats >92% GI: soft, bsx4 active/hypoactive   Extremities: warm/dry, 2+ bilateral edema, capillary refill greater/less than 3 seconds  Skin: no rashes or lesions  Labs/imaging that I havepersonally reviewed  (right click and "Reselect all SmartList Selections" daily)  5/17 CBC 5/15 BG 5/17 BMP 5/12 ECHO 5/17 CT chest  Resolved Hospital Problem list     Assessment & Plan:    Acute on Chronic Respiratory Failure with Hypoxemia secondary to likely VAP, with possible ARDS and volume overload -Initially intubated in 06/2020 at OSH in the setting of septic shock and hx OSA, failed extubation several times before trach -PF Ratio 91 of 5/15 ABG.  -Respiratory culture growing pseudomonas  without resistance done 5/12, no other significant micro results -CT scan with bilateral effusions and GGO's P: -Only Pseudomonas growing respiratory culture, narrow Vanc, Meropenem, acyclovir to cefepime, unsure why was on acyclovir other than persistent  fevers.  Spoke with pharmacy, sensitivities on respiratory culture done at [facility] more narrow than ours, so will repeat respiratory culture -Chest PT -Agree with obtaining TTE, he does not appear septic or in shock despite persistent fevers which could be secondary to VAP, infusion related,  or secondary to developing pleural effusions, has RUE PICC -Upper extremity venous Dopplers as previously ordered -Appears grossly volume overloaded, no diuresis on report -Lasix 40 mg twice daily, monitor urine output and volume status -LTVV strategy with tidal volumes of 4-8 cc/kg ideal body weight -Goal plateau pressures of 30 and driving pressures of 15 -Wean PEEP/FiO2 for SpO2 greater than 92 -Follow intermittent CXR and ABG PRN -Considering proning if oxygenation does not improve -VAP bundle   HFpEF Echo 5/12 with LVEF 60-65% and grade I diastolic dysfunction P: -Lasix 40mg  bid -monitor resopnse, foley in place    Metabolic Encephalopathy Reports of encepalopathy at [facility]. ? Secondary to hypoxia or infection. Unclear chronicity, though step-mother thinks that he has been sedated at [facility] and minimally responsive for at least two weeks P: -Stat head CT and ABG -Hold sedation -Check ammonia -May need EEG -Monitor for electrolyte disturbances    Hyperglycemia -Initate SSI -Blood Glucose goal 140-180.  Best practice (right click and "Reselect all SmartList Selections" daily)  Diet:  Tube Feed  Pain/Anxiety/Delirium protocol (if indicated): No, somnolent VAP protocol (if indicated): Yes DVT prophylaxis: LMWH GI prophylaxis: PPI Glucose control:  SSI Yes Central venous access:  Yes, and it is still needed Arterial line:  N/A Foley:  Yes, and it is still needed Mobility:  bed rest  PT consulted: N/A Last date of multidisciplinary goals of care discussion [pending] Code Status:  full code Disposition: ICU  Labs   CBC: Recent Labs  Lab 08/14/20 0540 08/15/20 0352  08/16/20 0435 08/17/20 0347 08/19/20 0427  WBC 19.2* 13.4* 13.3* 14.7* 14.3*  HGB 7.2* 7.5* 6.6* 8.2* 8.3*  HCT 26.1* 26.5* 23.3* 28.5* 28.4*  MCV 99.6 97.8 97.5 94.4 95.9  PLT 466* 440* 490* 532* 495*    Basic Metabolic Panel: Recent Labs  Lab 08/14/20 0540 08/15/20 0352 08/16/20 0435 08/17/20 0347 08/19/20 0427 08/20/20 0627  NA 146* 144 146* 142 139  --   K 5.5* 4.6 4.5 4.3 4.4  --   CL 116* 112* 114* 113* 109  --   CO2 26 24 24 22 23   --   GLUCOSE 286* 310* 276* 302* 175*  --   BUN 53* 57* 52* 42* 38*  --   CREATININE 0.95 0.74 0.72 0.53* 0.54*  --   CALCIUM 9.3 9.1 9.5 10.1 10.3  --   MG 1.9 1.8 1.9 1.8 1.7 2.2  PHOS 4.1 3.2 3.7 4.2  --   --    GFR: CrCl cannot be calculated (Unknown ideal weight.). Recent Labs  Lab 08/15/20 0352 08/16/20 0435 08/17/20 0347 08/19/20 0427  WBC 13.4* 13.3* 14.7* 14.3*    Liver Function Tests: Recent Labs  Lab 08/14/20 0540 08/15/20 0352  ALBUMIN 1.9* 1.7*   No results for input(s): LIPASE, AMYLASE in the last 168 hours. No results for input(s): AMMONIA in the last 168 hours.  ABG    Component Value Date/Time   PHART 7.312 (L) 08/17/2020 0922   PCO2ART 48.0 08/17/2020 08/19/2020  PO2ART 63.7 (L) 08/17/2020 0922   HCO3 23.6 08/17/2020 0922   ACIDBASEDEF 1.8 08/17/2020 0922   O2SAT 91.4 08/17/2020 0922     Coagulation Profile: No results for input(s): INR, PROTIME in the last 168 hours.  Cardiac Enzymes: No results for input(s): CKTOTAL, CKMB, CKMBINDEX, TROPONINI in the last 168 hours.  HbA1C: Hgb A1c MFr Bld  Date/Time Value Ref Range Status  07/16/2020 06:09 AM 11.0 (H) 4.8 - 5.6 % Final    Comment:    (NOTE) Pre diabetes:          5.7%-6.4%  Diabetes:              >6.4%  Glycemic control for   <7.0% adults with diabetes     CBG: Recent Labs  Lab 08/20/20 1922  GLUCAP 226*    Review of Systems:   Unable to obtain ROS due to patient status  Past Medical History:  He,  has a past medical  history of Acute on chronic respiratory failure with hypoxia (HCC), Acute renal failure due to tubular necrosis (HCC), History of alcohol abuse, Obstructive sleep apnea, and Severe sepsis (HCC).   Surgical History:  Unable to obtain secondary to mental status  Social History:      Family History:  Not available  Allergies Not on File   Home Medications  Prior to Admission medications   Not on File     Critical care time: 55 minutes       CRITICAL CARE Performed by: Darcella Gasman Kayana Thoen   Total critical care time: 55 minutes  Critical care time was exclusive of separately billable procedures and treating other patients.  Critical care was necessary to treat or prevent imminent or life-threatening deterioration.  Critical care was time spent personally by me on the following activities: development of treatment plan with patient and/or surrogate as well as nursing, discussions with consultants, evaluation of patient's response to treatment, examination of patient, obtaining history from patient or surrogate, ordering and performing treatments and interventions, ordering and review of laboratory studies, ordering and review of radiographic studies, pulse oximetry and re-evaluation of patient's condition.   Darcella Gasman Maire Govan, PA-C [facility] See Amion for pager If no response to pager , please call [phone] until 7pm After 7:00 pm call Elink  [phone]

## 2020-08-20 NOTE — Progress Notes (Signed)
eLink Physician-Brief Progress Note Patient Name: Alex Rojas DOB: 07-10-1970 MRN: [id number]   Date of Service  08/20/2020  HPI/Events of Note  [facility] with acute on chronic respiratory failure, bilateral pneumonia, altered mental status, AKI, and sepsis.  eICU Interventions  New Patient Evaluation.        Migdalia Dk 08/20/2020, 8:42 PM

## 2020-08-20 NOTE — Progress Notes (Addendum)
Pharmacy Antibiotic Note  Alex Rojas is a 50 y.o. male admitted on 08/20/2020 from [facility] with Pseudomonas aeruginosa pneumonia.  Pharmacy has been consulted for Cefepime dosing.   Patient was receiving multiple antibiotics at [facility] - last being Vancomycin and Merrem. Vancomycin was on hold due to high Vancomycin trough on 5/17 of 24. Last Merrem dose given at ~1400 PM best [facility] RN could tell. Patient transferred to [facility] on 5/18 at ~1900 PM.   Sensitivities for Pseudomonas were sensitive to Cipro, Gent, and Imipenem and resistant to Ceftazidime. No report for Cefepime/Zosyn. Discussed with Alex Rieger Gleason, NP for CCM and ok with narrowing to Cefepime for now.   Consider if Acyclovir may have been contributing to altered mental status?   Plan: Start Cefepime 2 g IV every 8 hours.  Monitor renal function, culture results, and clinical status.    Height: 5\' 5"  (165.1 cm) IBW/kg (Calculated) : 61.5  Temp (24hrs), Avg:99.4 F (37.4 C), Min:99.4 F (37.4 C), Max:99.4 F (37.4 C)  Recent Labs  Lab 08/14/20 0540 08/15/20 0352 08/15/20 2212 08/16/20 0435 08/16/20 2135 08/17/20 0347 08/18/20 0938 08/19/20 0427 08/19/20 2151 08/20/20 1116  WBC 19.2* 13.4*  --  13.3*  --  14.7*  --  14.3*  --   --   CREATININE 0.95 0.74  --  0.72  --  0.53*  --  0.54*  --   --   VANCOTROUGH  --   --    < >  --    < >  --    < >  --  24* 16   < > = values in this interval not displayed.    CrCl cannot be calculated (Unknown ideal weight.).    Not on File  Antimicrobials this admission: Vanc 5/12 >>5/18  Cefepime 5/12>>5/15; 5/18 >> Merrem 5/15 >>5/18 Acyclovir 5/12 >>5/18  Dose adjustments this admission:   Microbiology results: 5/12 Tracheal aspirate - Pseudomonas aeruginosa 5/12 BCx - negative 5/12 UCx - negative  Thank you for allowing pharmacy to be a part of this patient's care.  7/12 08/20/2020 9:52 PM

## 2020-08-20 NOTE — H&P (Shared)
NAME:  Alex Rojas, MRN:  [id number], DOB:  05-May-1970, LOS: 0 ADMISSION DATE:  07/14/2020, CONSULTATION DATE: 5/18 REFERRING MD:  Dr. Manson Passey ([facility]), CHIEF COMPLAINT:  Refractory Hypoxia  History of Present Illness:  [name] is a 50 y.o. M who [facility] on 5/17 for refractory hypoxia.  He has a pertinent past medical history OSA, Tracheostomy (2022), Diastolic Left heart failure, DM, DKA, metabolic encephalopathy, sepsis post right groin infection s/p I&D, smoking history   Per chart review, Mr. Humbarger was admitted [facility] for [facility] post hospital admission for sepsis from a right groin infection s/p I&D. Prior to his [facility] admission, He was extubated and re-intubated multiple times. He was trached in early April 2022 continued to require mechanical ventilation.     On 5/18 PCCM was consulted for further care after the patients FiO2 requirement had increased from 85% to 100%. CT scan concerning for bilateral ground glass opacities and infiltrates. Per Dr. Manson Passey he has remained persistently febrile through his admission, with [facility] ID following. He was being treated with Vancomycin, Meropenem, and Acyclovir.   He is currently on *** in critical condition.  Pertinent  Medical History  OSA, Tracheostomy (2022), Diastolic Left heart failure, DM, DKA, metabolic encephalopathy, sepsis post right groin infection s/p I&D, smoking history   Significant Hospital Events: Including procedures, antibiotic start and stop dates in addition to other pertinent events   April 2022-Tracheostomy 4/11 Admit to [facility] 5/12 TA- Psuedomonas, ECHO LVEF 60-65% RVSF normal 5/18- Transfer from [facility]. PCCM consulted. On Vanc/Mero/Acyclovir PTA.   Interim History / Subjective:  See above  Unable to obtain subjective evaluation due to patient status Objective   There were no vitals taken for this visit. PAP: ()/()       No intake or output data in the 24 hours ending 08/20/20 1355 There were no vitals filed for this visit.  Examination: General:   HEENT: MM pink/moist, icteric/anicteric, trachea midline  Neuro: GCS, RASS, PERRL? CV: S1S2, ***, no m/r/g appreciated PULM:  *** in the upper lobes, in the lower lobes, ***secretions, chest expansion symmetric GI: soft, bsx4 active/hypoactive   Extremities: warm/dry, *** edema, capillary refill greater/less than 3 seconds  Skin: no rashes or lesions  Labs/imaging that I {ACTIONS; HAVE/HAVE NOT:19434}personally reviewed  (right click and "Reselect all SmartList Selections" daily)  5/17 CBC 5/15 BG 5/17 BMP 5/12 ECHO 5/17 CT chest  Resolved Hospital Problem list     Assessment & Plan:  Acute on Chronic Respiratory Failure with Hypoxemia ARDS Hx OSA PF Ratio 91 of 5/15 ABG. S/P trach, Trach vent dependent.  -LTVV strategy with tidal volumes of 4-8 cc/kg ideal body weight -Goal plateau pressures of 30 and driving pressures of 15 -Wean PEEP/FiO2 for SpO2 greater than 92 -Follow intermittent CXR and ABG PRN -Considering proning if oxygenation does not improve -PAD bundle with propofol/fentanyl. Goal RASS -3. Titrate to goal -VAP bundle  ?Sepsis Persistent fevers Leukocytosis Chronic hospital admission, on antibiotics, WBC 13.3>14.7>14.3, picc in place, Psuedomonas present on 5/12. -Obtain UC, BC, TA, Lactate, ammonia -Obtain TTE -ID consult -Continue Vancomycin/Meropenen/Acyclovir.  Acute Metabolic Encephalopathy Reports of encepalopathy at [facility]. ? Secondary to hypoxia or infection. -PAD bundle as discussed above -CMP to rule out metabolic disturbances  Hx Acute Renal Failure due to Tubular Necrosis Creat 0.72>0.53>0.54 -Aggressive diuresis -Ensure renal perfusion. Goal MAP 65 or greater. -Avoid neprotoxic drugs as possible. -Strict I&O's -Follow up AM creatinine  Hx of ETOH abuse -Monitor  Hyperglycemia -Initate SSI -Blood  Glucose goal 140-180.  Best practice (right click and "Reselect all SmartList Selections" daily)  Diet:  {FBPZ:02585} Pain/Anxiety/Delirium protocol (if indicated): {Pain/Anxiety/Delirium:26941} VAP protocol (if indicated): {VAP:29640} DVT prophylaxis: {DVT Prophylaxis:26933} GI prophylaxis: {GI:26934} Glucose control:  {Glucose Control:26935} Central venous access:  {Central Venous Access:26936} Arterial line:  {Central Venous Access:26936} Foley:  {Central Venous Access:26936} Mobility:  {Mobility:26937}  PT consulted: {PT Consult:26938} Last date of multidisciplinary goals of care discussion [***] Code Status:  {Code Status:26939} Disposition: ***  Labs   CBC: Recent Labs  Lab 08/14/20 0540 08/15/20 0352 08/16/20 0435 08/17/20 0347 08/19/20 0427  WBC 19.2* 13.4* 13.3* 14.7* 14.3*  HGB 7.2* 7.5* 6.6* 8.2* 8.3*  HCT 26.1* 26.5* 23.3* 28.5* 28.4*  MCV 99.6 97.8 97.5 94.4 95.9  PLT 466* 440* 490* 532* 495*    Basic Metabolic Panel: Recent Labs  Lab 08/14/20 0540 08/15/20 0352 08/16/20 0435 08/17/20 0347 08/19/20 0427 08/20/20 0627  NA 146* 144 146* 142 139  --   K 5.5* 4.6 4.5 4.3 4.4  --   CL 116* 112* 114* 113* 109  --   CO2 26 24 24 22 23   --   GLUCOSE 286* 310* 276* 302* 175*  --   BUN 53* 57* 52* 42* 38*  --   CREATININE 0.95 0.74 0.72 0.53* 0.54*  --   CALCIUM 9.3 9.1 9.5 10.1 10.3  --   MG 1.9 1.8 1.9 1.8 1.7 2.2  PHOS 4.1 3.2 3.7 4.2  --   --    GFR: CrCl cannot be calculated (Unknown ideal weight.). Recent Labs  Lab 08/15/20 0352 08/16/20 0435 08/17/20 0347 08/19/20 0427  WBC 13.4* 13.3* 14.7* 14.3*    Liver Function Tests: Recent Labs  Lab 08/14/20 0540 08/15/20 0352  ALBUMIN 1.9* 1.7*   No results for input(s): LIPASE, AMYLASE in the last 168 hours. No results for input(s): AMMONIA in the last 168 hours.  ABG    Component Value Date/Time   PHART 7.312 (L) 08/17/2020 0922   PCO2ART 48.0 08/17/2020 0922   PO2ART 63.7 (L)  08/17/2020 0922   HCO3 23.6 08/17/2020 0922   ACIDBASEDEF 1.8 08/17/2020 0922   O2SAT 91.4 08/17/2020 0922     Coagulation Profile: No results for input(s): INR, PROTIME in the last 168 hours.  Cardiac Enzymes: No results for input(s): CKTOTAL, CKMB, CKMBINDEX, TROPONINI in the last 168 hours.  HbA1C: Hgb A1c MFr Bld  Date/Time Value Ref Range Status  07/16/2020 06:09 AM 11.0 (H) 4.8 - 5.6 % Final    Comment:    (NOTE) Pre diabetes:          5.7%-6.4%  Diabetes:              >6.4%  Glycemic control for   <7.0% adults with diabetes     CBG: No results for input(s): GLUCAP in the last 168 hours.  Review of Systems:   Unable to obtain ROS due to patient status  Past Medical History:  He,  has a past medical history of Acute on chronic respiratory failure with hypoxia (HCC), Acute renal failure due to tubular necrosis (HCC), History of alcohol abuse, Obstructive sleep apnea, and Severe sepsis (HCC).   Surgical History:  *** The histories are not reviewed yet. Please review them in the "History" navigator section and refresh this SmartLink.   Social History:      Family History:  His family history is not on file.  Allergies Not on File   Home Medications  Prior to Admission medications   Not on File     Critical care time: ***

## 2020-08-21 DIAGNOSIS — J151 Pneumonia due to Pseudomonas: Principal | ICD-10-CM

## 2020-08-21 DIAGNOSIS — I5031 Acute diastolic (congestive) heart failure: Secondary | ICD-10-CM | POA: Diagnosis not present

## 2020-08-21 LAB — BASIC METABOLIC PANEL
Anion gap: 6 (ref 5–15)
Anion gap: 5 (ref 5–15)
BUN: 46 mg/dL — ABNORMAL HIGH (ref 6–20)
BUN: 49 mg/dL — ABNORMAL HIGH (ref 6–20)
CO2: 29 mmol/L (ref 22–32)
CO2: 30 mmol/L (ref 22–32)
Calcium: 10.3 mg/dL (ref 8.9–10.3)
Calcium: 10.1 mg/dL (ref 8.9–10.3)
Chloride: 108 mmol/L (ref 98–111)
Chloride: 107 mmol/L (ref 98–111)
Creatinine, Ser: 0.73 mg/dL (ref 0.61–1.24)
Creatinine, Ser: 0.62 mg/dL (ref 0.61–1.24)
GFR, Estimated: >60 mL/min (ref >60)
GFR, Estimated: >60 mL/min (ref >60)
Glucose, Bld: 224 mg/dL — ABNORMAL HIGH (ref 70–99)
Glucose, Bld: 175 mg/dL — ABNORMAL HIGH (ref 70–99)
Potassium: 4.9 mmol/L (ref 3.5–5.1)
Potassium: 4.2 mmol/L (ref 3.5–5.1)
Sodium: 143 mmol/L (ref 135–145)
Sodium: 142 mmol/L (ref 135–145)

## 2020-08-21 LAB — MAGNESIUM: Magnesium: 2.1 mg/dL (ref 1.7–2.4)

## 2020-08-21 LAB — GLUCOSE, CAPILLARY
Glucose-Capillary: 171 mg/dL — ABNORMAL HIGH (ref 70–99)
Glucose-Capillary: 178 mg/dL — ABNORMAL HIGH (ref 70–99)
Glucose-Capillary: 183 mg/dL — ABNORMAL HIGH (ref 70–99)
Glucose-Capillary: 191 mg/dL — ABNORMAL HIGH (ref 70–99)
Glucose-Capillary: 242 mg/dL — ABNORMAL HIGH (ref 70–99)
Glucose-Capillary: 248 mg/dL — ABNORMAL HIGH (ref 70–99)
Glucose-Capillary: 253 mg/dL — ABNORMAL HIGH (ref 70–99)

## 2020-08-21 LAB — CBC
HCT: 22.9 % — ABNORMAL LOW (ref 39.0–52.0)
Hemoglobin: 6.7 g/dL — CL (ref 13.0–17.0)
MCH: 27.9 pg (ref 26.0–34.0)
MCHC: 29.3 g/dL — ABNORMAL LOW (ref 30.0–36.0)
MCV: 95.4 fL (ref 80.0–100.0)
Platelets: 487 10*3/uL — ABNORMAL HIGH (ref 150–400)
RBC: 2.4 MIL/uL — ABNORMAL LOW (ref 4.22–5.81)
RDW: 19.4 % — ABNORMAL HIGH (ref 11.5–15.5)
WBC: 10.7 10*3/uL — ABNORMAL HIGH (ref 4.0–10.5)
nRBC: 0.9 % — ABNORMAL HIGH (ref 0.0–0.2)

## 2020-08-21 LAB — MRSA PCR SCREENING: MRSA by PCR: NEGATIVE

## 2020-08-21 LAB — HEMOGLOBIN AND HEMATOCRIT, BLOOD
HCT: 27.5 % — ABNORMAL LOW (ref 39.0–52.0)
Hemoglobin: 8.1 g/dL — ABNORMAL LOW (ref 13.0–17.0)

## 2020-08-21 LAB — PREPARE RBC (CROSSMATCH)

## 2020-08-21 LAB — PHOSPHORUS: Phosphorus: 5.7 mg/dL — ABNORMAL HIGH (ref 2.5–4.6)

## 2020-08-21 LAB — AMMONIA: Ammonia: 17 umol/L (ref 9–35)

## 2020-08-21 MED ORDER — VITAL HIGH PROTEIN PO LIQD
1000.0000 mL | ORAL | Status: AC
  Administered 2020-08-21: 1000 mL

## 2020-08-21 MED ORDER — CHLORHEXIDINE GLUCONATE CLOTH 2 % EX PADS
6.0000 | MEDICATED_PAD | Freq: Every day | CUTANEOUS | Status: DC
  Administered 2020-08-21 – 2020-08-26 (×6): 6 via TOPICAL

## 2020-08-21 MED ORDER — SODIUM CHLORIDE 0.9% IV SOLUTION: Freq: Once | INTRAVENOUS | Status: AC

## 2020-08-21 MED ORDER — INSULIN GLARGINE 100 UNIT/ML ~~LOC~~ SOLN
20.0000 [IU] | Freq: Every day | SUBCUTANEOUS | Status: DC
  Administered 2020-08-21: 20 [IU] via SUBCUTANEOUS
  Filled 2020-08-21 (×2): qty 0.2

## 2020-08-21 MED ORDER — FAMOTIDINE 20 MG PO TABS
20.0000 mg | ORAL_TABLET | Freq: Two times a day (BID) | ORAL | Status: DC
  Administered 2020-08-21 – 2020-08-27 (×13): 20 mg
  Filled 2020-08-21 (×13): qty 1

## 2020-08-21 MED ORDER — FERROUS SULFATE 300 (60 FE) MG/5ML PO SYRP
300.0000 mg | ORAL_SOLUTION | Freq: Every day | ORAL | Status: DC
  Administered 2020-08-21 – 2020-08-27 (×7): 300 mg
  Filled 2020-08-21 (×7): qty 5

## 2020-08-21 MED ORDER — SODIUM CHLORIDE 0.9 % IV SOLN
2.0000 g | Freq: Three times a day (TID) | INTRAVENOUS | Status: AC
  Administered 2020-08-21 – 2020-08-24 (×11): 2 g via INTRAVENOUS
  Filled 2020-08-21 (×12): qty 2

## 2020-08-21 MED ORDER — ORAL CARE MOUTH RINSE
15.0000 mL | OROMUCOSAL | Status: DC
  Administered 2020-08-21 – 2020-08-27 (×59): 15 mL via OROMUCOSAL

## 2020-08-21 MED ORDER — FUROSEMIDE 10 MG/ML IJ SOLN
40.0000 mg | Freq: Once | INTRAMUSCULAR | Status: AC
  Administered 2020-08-21: 40 mg via INTRAVENOUS
  Filled 2020-08-21: qty 4

## 2020-08-21 MED ORDER — VITAL 1.5 CAL PO LIQD
1000.0000 mL | ORAL | Status: DC
  Administered 2020-08-21 – 2020-08-26 (×6): 1000 mL
  Filled 2020-08-21 (×5): qty 1000

## 2020-08-21 MED ORDER — SODIUM CHLORIDE 0.9% FLUSH
10.0000 mL | INTRAVENOUS | Status: DC | PRN
Start: 2020-08-21 — End: 2020-08-27

## 2020-08-21 MED ORDER — PROSOURCE TF PO LIQD
45.0000 mL | Freq: Two times a day (BID) | ORAL | Status: DC
  Administered 2020-08-21 – 2020-08-27 (×13): 45 mL
  Filled 2020-08-21 (×13): qty 45

## 2020-08-21 MED ORDER — CHLORHEXIDINE GLUCONATE 0.12% ORAL RINSE (MEDLINE KIT)
15.0000 mL | Freq: Two times a day (BID) | OROMUCOSAL | Status: DC
  Administered 2020-08-21 – 2020-08-27 (×13): 15 mL via OROMUCOSAL

## 2020-08-21 MED ORDER — SODIUM CHLORIDE 0.9% FLUSH
10.0000 mL | Freq: Two times a day (BID) | INTRAVENOUS | Status: DC
  Administered 2020-08-21 – 2020-08-27 (×10): 10 mL

## 2020-08-21 MED ORDER — SODIUM CHLORIDE 0.9 % IV SOLN: INTRAVENOUS | Status: DC | PRN

## 2020-08-21 NOTE — Progress Notes (Addendum)
eLink Physician-Brief Progress Note Patient Name: Alex Rojas DOB: 30-Apr-1970 MRN: [id number]   Date of Service  08/21/2020  HPI/Events of Note  Hemoglobin 6.7 gm / dl. No obvious bleeding focus.  eICU Interventions  Transfusion of one unit PRBC ordered. Informed consent for the blood transfusion obtained from patient's step mom Ms. Enid Skeens.        Thomasene Lot Syana Degraffenreid 08/21/2020, 6:15 AM

## 2020-08-21 NOTE — Progress Notes (Signed)
NAME:  Alex Rojas, MRN:  [id number], DOB:  10-15-1970, LOS: 1 ADMISSION DATE:  08/20/2020, CONSULTATION DATE: 5/18 REFERRING MD:  Dr. Manson Passey ([facility]), CHIEF COMPLAINT:  Refractory Hypoxia  History of Present Illness:  [name] is a 50 y.o. M who [facility] on 5/17 for refractory hypoxia.  He has a pertinent past medical history OSA, Tracheostomy (2022), Diastolic Left heart failure, DM, DKA, metabolic encephalopathy, sepsis post right groin infection s/p I&D, smoking history   Per chart review, [name] [facility] for [facility] post hospital admission for sepsis from a right groin infection s/p I&D. Prior to his [facility] admission, He was extubated and re-intubated multiple times. He was trached in early April 2022 continued to require mechanical ventilation.     On 5/18 PCCM was consulted for further care after the patients FiO2 requirement had increased from 85% to 100%. CT scan concerning for bilateral ground glass opacities and infiltrates. Per Dr. Manson Passey he has remained persistently febrile through his admission, with [facility] following. He was being treated with Vancomycin, Meropenem, and Acyclovir.   Pt arrived to ICU completely unresponsive off sedation, per RN this was part of the reason for transfer.  It is unclear what his baseline has been.  Cardiology note from yesterday notes A&O x0  Pertinent  Medical History  OSA, Tracheostomy (2022), Diastolic Left heart failure, DM, DKA, metabolic encephalopathy, sepsis post right groin infection s/p I&D, smoking history   Significant Hospital Events: Including procedures, antibiotic start and stop dates in addition to other pertinent events   . April 2022-Tracheostomy . 4/11 Admit to [facility] . 5/12 TA- Psuedomonas, ECHO LVEF 60-65% RVSF normal . 5/18- Transfer from [facility]. PCCM consulted. On Vanc/Mero/Acyclovir PTA.   Interim History /  Subjective:  Hgb came back 6.7 overnight. Transfusion of 1u pRBCs ordered. No acute events.  Objective   Blood pressure 117/66, pulse 87, temperature (!) 100.7 F (38.2 C), temperature source Oral, resp. rate (!) 24, height 5\' 5"  (1.651 m), weight 109.4 kg, SpO2 95 %.    Vent Mode: PRVC FiO2 (%):  [80 %-100 %] 80 % Set Rate:  [22 bmp-24 bmp] 24 bmp Vt Set:  [400 mL-500 mL] 400 mL PEEP:  [10 cmH20-15 cmH20] 15 cmH20 Plateau Pressure:  [23 cmH20-28 cmH20] 27 cmH20   Intake/Output Summary (Last 24 hours) at 08/21/2020 0902 Last data filed at 08/21/2020 0800 Gross per 24 hour  Intake 205.36 ml  Output 1900 ml  Net -1694.64 ml   Filed Weights   08/20/20 2000 08/21/20 0500  Weight: 110 kg 109.4 kg    Examination: General: Ill-appearing, diaphoretic, lying in bed on vent. HEENT: Moist mucus membranes. Sclera anicteric, trach in place Neuro: Pupils equal and reactive. Unresponsive to voice or pain.  CV: Regular rate and rhythm. No murmurs, rubs, gallops. PULM:  Bronchial breath sounds bilaterally. Equal chest wall expansion. On vent requiring PRVC with 85% FiO2 and PEEP 10 to maintain sats >92% GI: soft, hypoactive bowel sounds Extremities: warm, dry. 2+ pitting edema in upper and lower extremities Skin: no rashes or lesions  Labs/imaging that I havepersonally reviewed  (right click and "Reselect all SmartList Selections" daily)  Hgb 6.7; iron 34, TIBC 266, ferritin 308 WBC 10.7 ABG pH 7.325, pCO2 54, pO2 66, HCO3 29 CT Head: no acute abnormality Abd XR: no free air or obstructive process  Resolved Hospital Problem list     Assessment & Plan:    #  Acute on Chronic Respiratory Failure with Hypoxemia secondary to likely VAP and volume overload, with possible ARDS -Initially intubated in 06/2020 at OSH in the setting of septic shock and hx OSA, failed extubation several times before trach -PF Ratio 91 of 5/15 ABG.  -CT scan with bilateral effusions and GGO's - TTE 5/12 LVEF  60-65%, no regional wall motion abnormalities, grade 1 diastolic dysfunction -Respiratory culture growing pseudomonas with resistance to ceftazidime, blood and urine cultures no growth - Pitting edema on exam suggests fluid overload. May be secondary to HF or iatrogenic. Received 2 doses of Lasix 40mg . P: -Switch back to meropenem for pseudomonas pneumonia. Resistant to ceftazidime so risk of resistance to cefepime, as well - Repeat respiratory culture, results pending - Received second dose of Lasix this AM. Will monitor urine output later this AM/early afternoon and repeat BMP before giving another dose -TEE ordered -LTVV strategy with tidal volumes of 4-8 cc/kg ideal body weight -Goal plateau pressures of 30 and driving pressures of 15 -Wean PEEP/FiO2 for SpO2 greater than 92 -Follow intermittent CXR and ABG PRN -Considering proning if oxygenation does not improve -VAP bundle -Chest PT  #HFpEF Echo 5/12 with LVEF 60-65% and grade I diastolic dysfunction P: -Lasix as above, monitor response, foley in place  #Fever -Pt has been persistently febrile throughout his admission at [facility]. Continues to have low-grade fevers here. Fever could be secondary to VAP, pleural effusions, anoxic brain injury, DVT P: -Upper extremity venous Dopplers as previously ordered -Treat infectious source (pneumonia) as described above -Continue to monitor  #Metabolic Encephalopathy Reports of encepalopathy at [facility]. Unclear etiology - possibly 2/2 to hypoxia or infection. Unclear chronicity, though step-mother thinks that he has been sedated at [facility] and minimally responsive for at least two weeks. CT showed no acute abnormality. ABG showed mild acidemia and hypercarbia, but not high enough to explain encephalopathy. Ammonia level wnl. P: -Hold sedation -Monitor for electrolyte disturbances - May need EEG and MRI if fails to improve  #Anemia Hgb 6.7 overnight. Pt has had hemoglobin between 7-8 for past  10-12 days. Two weeks ago it was 9.2. Likely a chronic issue for patient. Iron studies revealed low iron (34), normal ferritin. Patient currently receiving 1 unit pRBCs. Pt would also benefit from iron supplementation. Blood cultures showed no growth, and infection seems limited to lungs at this point, so would be ok to start iron supplement without worsening infection. P: - Recheck H/H after transfusion - Start on oral iron supplement  Hyperglycemia Glucose has been between 183-242 since admission. Pt has received 16 U insulin Aspart. He has now started tube feeds. P: - Lantus 20U QD + SSI, modify as needed -Blood Glucose goal 140-180.  Best practice (right click and "Reselect all SmartList Selections" daily)  Diet:  Tube Feed  Pain/Anxiety/Delirium protocol (if indicated): No, somnolent VAP protocol (if indicated): Yes DVT prophylaxis: LMWH GI prophylaxis: H2B Glucose control:  SSI Yes Central venous access:  Yes, and it is still needed Arterial line:  N/A Foley:  Yes, and it is still needed Mobility:  bed rest  PT consulted: N/A Last date of multidisciplinary goals of care discussion [pending] Code Status:  full code Disposition: ICU   [facility], MS4

## 2020-08-21 NOTE — Progress Notes (Signed)
Initial Nutrition Assessment  DOCUMENTATION CODES:   Obesity unspecified  INTERVENTION:   Tube feeding via G-tube: - Vital 1.5 @ 55 ml/hr (1320 ml/day) - ProSource TF 45 ml BID  Tube feeding regimen provides 2060 kcal, 111 grams of protein, and 1008 ml of H2O.   NUTRITION DIAGNOSIS:   Inadequate oral intake related to inability to eat as evidenced by NPO status.  GOAL:   Patient will meet greater than or equal to 90% of their needs  MONITOR:   Vent status,Labs,Weight trends,TF tolerance,I & O's  REASON FOR ASSESSMENT:   Ventilator,Consult Enteral/tube feeding initiation and management  ASSESSMENT:   50 year old male who presented on 5/18 from [facility] with refractory hypoxia. 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 Pt admitted with pneumonia.   Discussed pt with RN. Per notes, pt with volume overload and received 2 doses of lasix this AM. Pt with metabolic encephalopathy of unclear etiology. CT showing no acute abnormality. Per notes, pt may require EEG and MRI if fails to improve.  Patient is currently on ventilator support via trach MV: 10.7 L/min Temp (24hrs), Avg:99.5 F (37.5 C), Min:99 F (37.2 C), Max:100.7 F (38.2 C)  Medications reviewed and include: pepcid, ferrous sulfate, SSI q 4 hours, lantus 20 units daily, IV abx  Labs reviewed: BUN 46, phosphorus 5.7, hemoglobin 6.7 CBG's: 183-253 x 24 hours  UOP: 1900 ml x 12 hours I/O's: -1.6 L since admit  NUTRITION - FOCUSED PHYSICAL EXAM:  Flowsheet Row Most Recent Value  Orbital Region No depletion  Upper Arm Region No depletion  Thoracic and Lumbar Region No depletion  Buccal Region No depletion  Temple Region No depletion  Clavicle Bone Region No depletion  Clavicle and Acromion Bone Region No depletion  Scapular Bone Region Unable to assess  Dorsal Hand No depletion  Patellar  Region No depletion  Anterior Thigh Region No depletion  Posterior Calf Region No depletion  Edema (RD Assessment) Moderate  [BUE, BLE]  Hair Reviewed  Eyes Unable to assess  Mouth Reviewed  Skin Reviewed  Nails Reviewed       Diet Order:   Diet Order    None      EDUCATION NEEDS:   No education needs have been identified at this time  Skin:  Skin Assessment: Reviewed RN Assessment  Last BM:  no documented BM  Height:   Ht Readings from Last 1 Encounters:  08/20/20 5\' 5"  (1.651 m)    Weight:   Wt Readings from Last 1 Encounters:  08/21/20 109.4 kg    Ideal Body Weight:  62 kg  BMI:  Body mass index is 40.13 kg/m.  Estimated Nutritional Needs:   Kcal:  2000-2200  Protein:  110-130 grams  Fluid:  >/= 2.0 L    08/23/20, MS, RD, LDN Inpatient Clinical Dietitian Please see AMiON for contact information.

## 2020-08-22 DIAGNOSIS — J151 Pneumonia due to Pseudomonas: Secondary | ICD-10-CM | POA: Diagnosis not present

## 2020-08-22 DIAGNOSIS — J9611 Chronic respiratory failure with hypoxia: Secondary | ICD-10-CM | POA: Diagnosis not present

## 2020-08-22 DIAGNOSIS — I5031 Acute diastolic (congestive) heart failure: Secondary | ICD-10-CM | POA: Diagnosis not present

## 2020-08-22 LAB — BPAM RBC
Blood Product Expiration Date: 202206052359
ISSUE DATE / TIME: 202205190901
Unit Type and Rh: 6200

## 2020-08-22 LAB — CBC
HCT: 29.2 % — ABNORMAL LOW (ref 39.0–52.0)
Hemoglobin: 8.6 g/dL — ABNORMAL LOW (ref 13.0–17.0)
MCH: 28.4 pg (ref 26.0–34.0)
MCHC: 29.5 g/dL — ABNORMAL LOW (ref 30.0–36.0)
MCV: 96.4 fL (ref 80.0–100.0)
Platelets: 499 10*3/uL — ABNORMAL HIGH (ref 150–400)
RBC: 3.03 MIL/uL — ABNORMAL LOW (ref 4.22–5.81)
RDW: 20.5 % — ABNORMAL HIGH (ref 11.5–15.5)
WBC: 11.2 10*3/uL — ABNORMAL HIGH (ref 4.0–10.5)
nRBC: 0.5 % — ABNORMAL HIGH (ref 0.0–0.2)

## 2020-08-22 LAB — GLUCOSE, CAPILLARY
Glucose-Capillary: 197 mg/dL — ABNORMAL HIGH (ref 70–99)
Glucose-Capillary: 182 mg/dL — ABNORMAL HIGH (ref 70–99)
Glucose-Capillary: 210 mg/dL — ABNORMAL HIGH (ref 70–99)
Glucose-Capillary: 211 mg/dL — ABNORMAL HIGH (ref 70–99)
Glucose-Capillary: 191 mg/dL — ABNORMAL HIGH (ref 70–99)

## 2020-08-22 LAB — TYPE AND SCREEN
ABO/RH(D): A POS
Antibody Screen: NEGATIVE
Unit division: 0

## 2020-08-22 LAB — BASIC METABOLIC PANEL
Anion gap: 5 (ref 5–15)
BUN: 50 mg/dL — ABNORMAL HIGH (ref 6–20)
CO2: 30 mmol/L (ref 22–32)
Calcium: 10.4 mg/dL — ABNORMAL HIGH (ref 8.9–10.3)
Chloride: 107 mmol/L (ref 98–111)
Creatinine, Ser: 0.62 mg/dL (ref 0.61–1.24)
GFR, Estimated: >60 mL/min (ref >60)
Glucose, Bld: 205 mg/dL — ABNORMAL HIGH (ref 70–99)
Potassium: 3.8 mmol/L (ref 3.5–5.1)
Sodium: 142 mmol/L (ref 135–145)

## 2020-08-22 MED ORDER — AMLODIPINE BESYLATE 10 MG PO TABS
10.0000 mg | ORAL_TABLET | Freq: Every day | ORAL | Status: DC
  Administered 2020-08-22 – 2020-08-27 (×6): 10 mg
  Filled 2020-08-22 (×6): qty 1

## 2020-08-22 MED ORDER — ENOXAPARIN SODIUM 60 MG/0.6ML IJ SOSY
50.0000 mg | PREFILLED_SYRINGE | INTRAMUSCULAR | Status: DC
  Administered 2020-08-22 – 2020-08-26 (×5): 50 mg via SUBCUTANEOUS
  Filled 2020-08-22 (×5): qty 0.6

## 2020-08-22 MED ORDER — GUAIFENESIN 100 MG/5ML PO SOLN
5.0000 mL | Freq: Four times a day (QID) | ORAL | Status: DC
  Administered 2020-08-22 – 2020-08-27 (×22): 100 mg
  Filled 2020-08-22 (×22): qty 10

## 2020-08-22 MED ORDER — FUROSEMIDE 10 MG/ML IJ SOLN
40.0000 mg | Freq: Two times a day (BID) | INTRAMUSCULAR | Status: AC
  Administered 2020-08-22 – 2020-08-23 (×3): 40 mg via INTRAVENOUS
  Filled 2020-08-22 (×3): qty 4

## 2020-08-22 MED ORDER — POTASSIUM CHLORIDE 20 MEQ PO PACK
40.0000 meq | PACK | Freq: Once | ORAL | Status: AC
  Administered 2020-08-22: 40 meq
  Filled 2020-08-22: qty 2

## 2020-08-22 MED ORDER — INSULIN GLARGINE 100 UNIT/ML ~~LOC~~ SOLN
15.0000 [IU] | Freq: Two times a day (BID) | SUBCUTANEOUS | Status: DC
  Administered 2020-08-22 – 2020-08-27 (×11): 15 [IU] via SUBCUTANEOUS
  Filled 2020-08-22 (×13): qty 0.15

## 2020-08-22 MED ORDER — LACTULOSE 10 GM/15ML PO SOLN
10.0000 g | Freq: Every day | ORAL | Status: DC
  Administered 2020-08-22 – 2020-08-27 (×6): 10 g
  Filled 2020-08-22 (×6): qty 15

## 2020-08-22 NOTE — Progress Notes (Signed)
NAME:  Alex Rojas, MRN:  [id number], DOB:  Apr 04, 1971, LOS: 2 ADMISSION DATE:  08/20/2020, CONSULTATION DATE: 5/18 REFERRING MD:  Dr. Manson Passey ([facility]), CHIEF COMPLAINT:  Refractory Hypoxia  History of Present Illness:  [name] is a 50 y.o. M who [facility] on 5/17 for refractory hypoxia.  He has a pertinent past medical history OSA, Tracheostomy (2022), Diastolic Left heart failure, DM, DKA, metabolic encephalopathy, sepsis post right groin infection s/p I&D, smoking history   Per chart review, [name] [facility] for [facility] post hospital admission for sepsis from a right groin infection s/p I&D. Prior to his [facility] admission, He was extubated and re-intubated multiple times. He was trached in early April 2022 continued to require mechanical ventilation.     On 5/18 PCCM was consulted for further care after the patients FiO2 requirement had increased from 85% to 100%. CT scan concerning for bilateral ground glass opacities and infiltrates. Per Dr. Manson Passey he has remained persistently febrile through his admission, with [facility] ID following. He was being treated with Vancomycin, Meropenem, and Acyclovir.   Pt arrived to ICU completely unresponsive off sedation, per RN this was part of the reason for transfer.  It is unclear what his baseline has been.  Cardiology note from yesterday notes A&O x0  Pertinent  Medical History  OSA, Tracheostomy (2022), Diastolic Left heart failure, DM, DKA, metabolic encephalopathy, sepsis post right groin infection s/p I&D, smoking history   Significant Hospital Events: Including procedures, antibiotic start and stop dates in addition to other pertinent events   . April 2022-Tracheostomy . 4/11 [facility]mit to [facility] . 5/12 TA- Psuedomonas, ECHO LVEF 60-65% RVSF normal . 5/18- Transfer from [facility]. PCCM consulted. On Vanc/Mero/Acyclovir PTA.  . 5/19 - narrowed to  meropenem  Interim History / Subjective:  No acute events overnight. Per nursing, patient more alert, following commands.   Objective   Blood pressure 122/75, pulse 88, temperature 98.2 F (36.8 C), temperature source Axillary, resp. rate (!) 24, height 5\' 5"  (1.651 m), weight 106.2 kg, SpO2 95 %.    Vent Mode: PRVC FiO2 (%):  [70 %-80 %] 70 % Set Rate:  [24 bmp] 24 bmp Vt Set:  [400 mL] 400 mL PEEP:  [15 cmH20] 15 cmH20 Plateau Pressure:  [20 cmH20-24 cmH20] 24 cmH20   Intake/Output Summary (Last 24 hours) at 08/22/2020 0853 Last data filed at 08/22/2020 0800 Gross per 24 hour  Intake 2210.62 ml  Output 3775 ml  Net -1564.38 ml   Filed Weights   08/20/20 2000 08/21/20 0500 08/22/20 0539  Weight: 110 kg 109.4 kg 106.2 kg    Examination: General: Ill-appearing, diaphoretic, lying in bed on vent HEENT: Moist mucus membranes. Sclera anicteric. Trach in place. Neuro: Opens eyes to voice inconsistently. Opens eyes to touch/pain consistently. Followed some but not all commands. CV: Regular rate and rhythm. No murmurs, rubs, gallops. PULM:  Coarse breath sounds bilaterally. Equal chest wall expansion. On vent requiring PRVC with 70% FiO2 and PEEP 15 to maintain sats >92% GI: soft, +bowel sounds Extremities: warm, dry. 2+ pitting edema in upper and lower extremities Skin: no rashes or lesions  Labs/imaging that I havepersonally reviewed  (right click and "Reselect all SmartList Selections" daily)  Hgb 8.6 WBC 11.2 Cr 0.62 Na 142 K 3.8  Resolved Hospital Problem list     Assessment & Plan:    #Acute on Chronic Respiratory Failure with Hypoxemia secondary to  Pseudomonas pneumonia and volume overload -Initially intubated in 06/2020 at OSH in the setting of septic shock and hx OSA, failed extubation several times before trach -PF Ratio 91 of 5/15 ABG.  -CT scan with bilateral effusions and GGO's - TTE 5/12 LVEF 60-65%, no regional wall motion abnormalities, grade 1 diastolic  dysfunction -Respiratory culture growing pseudomonas with resistance to ceftazidime, blood and urine cultures no growth - FiO2 requirements down from yesterday and pt had no fevers overnight. Is more alert and responsive on physical exam today compared to yesterday. Still has pitting edema in upper and lower extremities consistent with volume overload. Received lasix 40mg  x 2 yesterday, and had urine output, electrolytes and kidney function remained wnl.   P: - Cont meropenem - Continue lasix - 40mg  x2 today and repeat dose tomorrow AM. Supplementing with potassium. Monitor UOP and electrolytes. - Cancel TEE - pt does not need - Wean FiO2 down to 60% before weaning PEEP for SpO2 greater than 92 -LTVV strategy with tidal volumes of 4-8 cc/kg ideal body weight -Goal plateau pressures of 30 and driving pressures of 15 -VAP bundle -Chest PT - Guaifenesin per tube for thick secretions  #HFpEF Echo 5/12 with LVEF 60-65% and grade I diastolic dysfunction P: -Lasix as above, monitor response, foley in place   #Metabolic Encephalopathy Reports of encepalopathy at [facility]. Unclear etiology - possibly 2/2 to hypoxia or infection. Unclear chronicity, though step-mother thinks that he has been sedated at [facility] and minimally responsive for at least two weeks. CT showed no acute abnormality. ABG showed mild acidemia and hypercarbia, but not high enough to explain encephalopathy. Ammonia level wnl. This morning, patient seems more alert and responsive than yesterday. Unclear what baseline is, but treatment of underlying infection and volume overload seems to be improving mental status. P: -Hold sedation -Monitor for electrolyte disturbances - May need EEG and MRI if worsens  #Anemia Likely chronic iron deficiency anemia. Hgb improved today (8.6).  P: - Cont oral iron supplement  Hyperglycemia Glucose has been between 191-205 since starting Lantus yesterday. Received 20U glargine and 2U  aspart yesterday. P: - Increase to Lantus 15U BID + SSI -Blood Glucose goal 140-180.  #Fever -Pt has been persistently febrile throughout his admission at [facility]. Had no fevers overnight. May be improving with treatment of infection and diuresis. P: -Treat infectious source (pneumonia) as described above -Continue to monitor  #HTN P:  - Cont home amlodipine  Best practice (right click and "Reselect all SmartList Selections" daily)  Diet:  Tube Feed  Pain/Anxiety/Delirium protocol (if indicated): No, somnolent VAP protocol (if indicated): Yes DVT prophylaxis: LMWH GI prophylaxis: H2B Glucose control:  SSI Yes Central venous access:  Yes, and it is still needed Arterial line:  N/A Foley:  Yes, and it is still needed Mobility:  bed rest  PT consulted: N/A Last date of multidisciplinary goals of care discussion [pending] Code Status:  full code Disposition: ICU   7/12, MS4

## 2020-08-23 ENCOUNTER — Inpatient Hospital Stay: Payer: Self-pay

## 2020-08-23 DIAGNOSIS — J9611 Chronic respiratory failure with hypoxia: Secondary | ICD-10-CM | POA: Diagnosis not present

## 2020-08-23 DIAGNOSIS — J151 Pneumonia due to Pseudomonas: Secondary | ICD-10-CM | POA: Diagnosis not present

## 2020-08-23 DIAGNOSIS — I5031 Acute diastolic (congestive) heart failure: Secondary | ICD-10-CM | POA: Diagnosis not present

## 2020-08-23 LAB — CBC
HCT: 30.3 % — ABNORMAL LOW (ref 39.0–52.0)
Hemoglobin: 8.8 g/dL — ABNORMAL LOW (ref 13.0–17.0)
MCH: 27.8 pg (ref 26.0–34.0)
MCHC: 29 g/dL — ABNORMAL LOW (ref 30.0–36.0)
MCV: 95.9 fL (ref 80.0–100.0)
Platelets: 460 10*3/uL — ABNORMAL HIGH (ref 150–400)
RBC: 3.16 MIL/uL — ABNORMAL LOW (ref 4.22–5.81)
RDW: 20.1 % — ABNORMAL HIGH (ref 11.5–15.5)
WBC: 8.9 10*3/uL (ref 4.0–10.5)
nRBC: 0.2 % (ref 0.0–0.2)

## 2020-08-23 LAB — BASIC METABOLIC PANEL
Anion gap: 9 (ref 5–15)
BUN: 40 mg/dL — ABNORMAL HIGH (ref 6–20)
CO2: 34 mmol/L — ABNORMAL HIGH (ref 22–32)
Calcium: 10.7 mg/dL — ABNORMAL HIGH (ref 8.9–10.3)
Chloride: 102 mmol/L (ref 98–111)
Creatinine, Ser: 0.52 mg/dL — ABNORMAL LOW (ref 0.61–1.24)
GFR, Estimated: >60 mL/min (ref >60)
Glucose, Bld: 195 mg/dL — ABNORMAL HIGH (ref 70–99)
Potassium: 4.2 mmol/L (ref 3.5–5.1)
Sodium: 145 mmol/L (ref 135–145)

## 2020-08-23 LAB — POCT I-STAT 7, (LYTES, BLD GAS, ICA,H+H)
Acid-Base Excess: 12 mmol/L — ABNORMAL HIGH (ref 0.0–2.0)
Bicarbonate: 37.1 mmol/L — ABNORMAL HIGH (ref 20.0–28.0)
Calcium, Ion: 1.45 mmol/L — ABNORMAL HIGH (ref 1.15–1.40)
HCT: 29 % — ABNORMAL LOW (ref 39.0–52.0)
Hemoglobin: 9.9 g/dL — ABNORMAL LOW (ref 13.0–17.0)
O2 Saturation: 93 %
Patient temperature: 100.6
Potassium: 4.5 mmol/L (ref 3.5–5.1)
Sodium: 147 mmol/L — ABNORMAL HIGH (ref 135–145)
TCO2: 39 mmol/L — ABNORMAL HIGH (ref 22–32)
pCO2 arterial: 54.2 mmHg — ABNORMAL HIGH (ref 32.0–48.0)
pH, Arterial: 7.447 (ref 7.350–7.450)
pO2, Arterial: 70 mmHg — ABNORMAL LOW (ref 83.0–108.0)

## 2020-08-23 LAB — GLUCOSE, CAPILLARY
Glucose-Capillary: 149 mg/dL — ABNORMAL HIGH (ref 70–99)
Glucose-Capillary: 174 mg/dL — ABNORMAL HIGH (ref 70–99)
Glucose-Capillary: 179 mg/dL — ABNORMAL HIGH (ref 70–99)
Glucose-Capillary: 186 mg/dL — ABNORMAL HIGH (ref 70–99)
Glucose-Capillary: 192 mg/dL — ABNORMAL HIGH (ref 70–99)
Glucose-Capillary: 205 mg/dL — ABNORMAL HIGH (ref 70–99)
Glucose-Capillary: 208 mg/dL — ABNORMAL HIGH (ref 70–99)

## 2020-08-23 MED ORDER — FUROSEMIDE 10 MG/ML IJ SOLN
40.0000 mg | Freq: Two times a day (BID) | INTRAMUSCULAR | Status: AC
  Administered 2020-08-23 – 2020-08-25 (×4): 40 mg via INTRAVENOUS
  Filled 2020-08-23 (×4): qty 4

## 2020-08-23 MED ORDER — SODIUM CHLORIDE 0.9% FLUSH: 10.0000 mL | INTRAVENOUS | Status: DC | PRN

## 2020-08-23 MED ORDER — SODIUM CHLORIDE 0.9% FLUSH
10.0000 mL | Freq: Two times a day (BID) | INTRAVENOUS | Status: DC
  Administered 2020-08-23 – 2020-08-24 (×3): 10 mL
  Administered 2020-08-25: 20 mL
  Administered 2020-08-25 – 2020-08-27 (×4): 10 mL

## 2020-08-23 NOTE — Progress Notes (Signed)
eLink Physician-Brief Progress Note Patient Name: Edith Lord DOB: [birth year] MRN: [id number]   Date of Service  08/23/2020  HPI/Events of Note  PICC line cap broke off during bath.   eICU Interventions  Plan: 1. Remove PICC line. 2. Replace PICC line on day shift if needed.      Intervention Category Major Interventions: Other:  Lenell Antu 08/23/2020, 5:24 AM

## 2020-08-23 NOTE — Progress Notes (Signed)
Peripherally Inserted Central Catheter Placement  The IV Nurse has discussed with the patient and/or persons authorized to consent for the patient, the purpose of this procedure and the potential benefits and risks involved with this procedure.  The benefits include less needle sticks, lab draws from the catheter, and the patient may be discharged home with the catheter. Risks include, but not limited to, infection, bleeding, blood clot (thrombus formation), and puncture of an artery; nerve damage and irregular heartbeat and possibility to perform a PICC exchange if needed/ordered by physician.  Alternatives to this procedure were also discussed.  Bard Power PICC patient education guide, fact sheet on infection prevention and patient information card has been provided to patient /or left at bedside.  PICC placed by medical necessity from Dr Delton Coombes due to inability to contact family.  PICC Placement Documentation  PICC Double Lumen 08/23/20 PICC Right Cephalic 42 cm 0 cm (Active)  Indication for Insertion or Continuance of Line Prolonged intravenous therapies 08/23/20 0928  Exposed Catheter (cm) 0 cm 08/23/20 8841  Site Assessment Clean;Dry;Intact 08/23/20 0928  Lumen #1 Status Flushed;Saline locked;Blood return noted 08/23/20 0928  Lumen #2 Status Flushed;Saline locked;Blood return noted 08/23/20 0928  Dressing Type Transparent 08/23/20 0928  Dressing Status Clean;Dry;Intact 08/23/20 0928  Antimicrobial disc in place? Yes 08/23/20 0928  Line Care Connections checked and tightened 08/23/20 0928  Line Adjustment (NICU/IV Team Only) No 08/23/20 0928  Dressing Intervention New dressing 08/23/20 0928  Dressing Change Due 08/30/20 08/23/20 0928       Elliot Dally 08/23/2020, 9:29 AM

## 2020-08-23 NOTE — Progress Notes (Addendum)
Consulted with Dr. Delton Coombes and Eunice Blase., RN about proceeding with PICC line replacement after incidental capp removal of the prior one in the pts left arm. At this time, family is unreachable for consent and placement will proceed under medical neccessicity. This pt is requiring abx into the month of June and has limited/poor vasculature.

## 2020-08-23 NOTE — Progress Notes (Signed)
LUA PICC removed due to catheter damage/port pulled off of catheter at hub.  Insertion site slightly pink. Catheter tip appears to be in tact- unknown insertion length. IVF at [facility] d/c'ed prior to removal.  Vaseline guaze and dry 4x4 covered with tegaderm applied to site.

## 2020-08-23 NOTE — Progress Notes (Signed)
NAME:  Alex Rojas, MRN:  [id number], DOB:  08/13/1970, LOS: 3 ADMISSION DATE:  08/20/2020, CONSULTATION DATE: 5/18 REFERRING MD:  Dr. Manson Passey ([facility]), CHIEF COMPLAINT:  Refractory Hypoxia  History of Present Illness:  [name] is a 50 y.o. M who [facility] on 5/17 for refractory hypoxia.  He has a pertinent past medical history OSA, Tracheostomy (2022), Diastolic Left heart failure, DM, DKA, metabolic encephalopathy, sepsis post right groin infection s/p I&D, smoking history   Per chart review, [name] post hospital admission for sepsis from a right groin infection s/p I&D. Prior to his [facility] admission, He was extubated and re-intubated multiple times. He was trached in early April 2022 continued to require mechanical ventilation.     On 5/18 PCCM was consulted for further care after the patients FiO2 requirement had increased from 85% to 100%. CT scan concerning for bilateral ground glass opacities and infiltrates. Per Dr. Manson Passey he has remained persistently febrile through his admission, with [facility] following. He was being treated with Vancomycin, Meropenem, and Acyclovir.   Pt arrived to ICU completely unresponsive off sedation, per RN this was part of the reason for transfer.  It is unclear what his baseline has been.  Cardiology note from yesterday notes A&O x0  Pertinent  Medical History  OSA, Tracheostomy (2022), Diastolic Left heart failure, DM, DKA, metabolic encephalopathy, sepsis post right groin infection s/p I&D, smoking history   Significant Hospital Events: Including procedures, antibiotic start and stop dates in addition to other pertinent events   . April 2022-Tracheostomy . 4/11 Admit to [facility] . 5/12 TA- Psuedomonas, ECHO LVEF 60-65% RVSF normal . 5/18- Transfer from [facility]. PCCM consulted. On Vanc/Mero/Acyclovir PTA.  . 5/19 - narrowed to  meropenem  Interim History / Subjective:   PICC cap broke, PICC removed PEEP to 14, FiO2 to 0.60  Objective   Blood pressure 128/78, pulse 98, temperature 100.1 F (37.8 C), temperature source Oral, resp. rate (!) 23, height 5\' 5"  (1.651 m), weight 106.2 kg, SpO2 94 %.    Vent Mode: PRVC FiO2 (%):  [60 %-70 %] 60 % Set Rate:  [24 bmp] 24 bmp Vt Set:  [400 mL] 400 mL PEEP:  [14 cmH20-15 cmH20] 14 cmH20 Plateau Pressure:  [18 cmH20-25 cmH20] 23 cmH20   Intake/Output Summary (Last 24 hours) at 08/23/2020 0716 Last data filed at 08/22/2020 2220 Gross per 24 hour  Intake 2012.98 ml  Output 4340 ml  Net -2327.02 ml   Filed Weights   08/20/20 2000 08/21/20 0500 08/22/20 0539  Weight: 110 kg 109.4 kg 106.2 kg    Examination: General: Ill-appearing man, laying in bed HEENT: Oropharynx clear, trach in place, clean and dry, stoma okay Neuro: Eyes open, appears to track, did not follow commands for me today CV: Regular, distant, no murmur PULM: Decreased at both bases, no wheezes, scattered inspiratory crackles GI: Nondistended, positive bowel sounds Extremities: Widespread 1+ edema Skin: No rash  Labs/imaging that I havepersonally reviewed  (right click and "Reselect all SmartList Selections" daily)  Na 145, intact renal fxn Normal WBC   Resolved Hospital Problem list     Assessment & Plan:    #Acute on Chronic Respiratory Failure with Hypoxemia secondary to Pseudomonas pneumonia and volume overload with pleural effusions -Initially intubated in 06/2020 at OSH in the setting of septic shock and hx OSA, failed extubation several times before trach -CT scan  with bilateral effusions and GGO's -TTE 5/12 LVEF 60-65%, no regional wall motion abnormalities, grade 1 diastolic dysfunction -Respiratory culture growing pseudomonas with resistance to ceftazidime, blood and urine cultures no growth -Some interval improvement with diuresis  P: -Meropenem started 5/18 (day 3 of  7) -Tolerating diuresis, -7.1 L total, plan to continue 5/21, follow renal function, follow chest x-ray for resolution pleural effusions -Wean FiO2 and PEEP.  Currently PEEP 14 -VAP prevention order set -Pulmonary hygiene -Guaifenesin for thick secretions   #HFpEF Echo 5/12 with LVEF 60-65% and grade I diastolic dysfunction P: -Blood pressure control and diuresis as he can tolerate   #Metabolic Encephalopathy Reports of encepalopathy at [facility]. Unclear etiology - possibly 2/2 to hypoxia or infection. Unclear chronicity, though step-mother thinks that he was sedated at [facility] and minimally responsive for at least two weeks.  Head CT showed no acute abnormality. ABG showed mild acidemia and hypercarbia, but not high enough to explain encephalopathy. Ammonia level wnl.  Some interval improvement since transfer to ICU P: -Continue to minimize sedation -Correct any electrolyte and metabolic abnormalities -Continue to treat for suspected pneumonia -Consider further neurological work-up including EEG, MRI if failing to resolve  #Anemia Likely chronic iron deficiency anemia. Hgb improved today (8.6).  P: -Continue iron replacement  Hyperglycemia P: -Lantus 15 units twice a day -Sliding scale insulin  #Fever -Pt has been persistently febrile throughout his admission at [facility]. Had no fevers overnight. May be improving with treatment of infection and diuresis. P: -Continue treat infectious source (pneumonia) as described above -Following  #HTN P:  -Continue home amlodipine  Best practice (right click and "Reselect all SmartList Selections" daily)  Diet:  Tube Feed  Pain/Anxiety/Delirium protocol (if indicated): No, somnolent VAP protocol (if indicated): Yes DVT prophylaxis: LMWH GI prophylaxis: H2B Glucose control:  SSI Yes Central venous access:  Yes, and it is still needed Arterial line:  N/A Foley:  Yes, and it is still needed Mobility:  bed rest  PT consulted: N/A Last  date of multidisciplinary goals of care discussion [pending] Code Status:  full code Disposition: ICU   Independent CC time 31 minutes  [name], MD, PhD 08/23/2020, 11:08 AM [facility] [phone] or if no answer before 7:00PM call [phone] For any issues after 7:00PM please call eLink [phone]

## 2020-08-24 ENCOUNTER — Inpatient Hospital Stay (HOSPITAL_COMMUNITY): Payer: No Typology Code available for payment source

## 2020-08-24 DIAGNOSIS — R400 Somnolence: Secondary | ICD-10-CM | POA: Diagnosis not present

## 2020-08-24 DIAGNOSIS — I5031 Acute diastolic (congestive) heart failure: Secondary | ICD-10-CM | POA: Diagnosis not present

## 2020-08-24 DIAGNOSIS — J151 Pneumonia due to Pseudomonas: Secondary | ICD-10-CM | POA: Diagnosis not present

## 2020-08-24 LAB — BASIC METABOLIC PANEL
Anion gap: 6 (ref 5–15)
BUN: 34 mg/dL — ABNORMAL HIGH (ref 6–20)
CO2: 35 mmol/L — ABNORMAL HIGH (ref 22–32)
Calcium: 10.4 mg/dL — ABNORMAL HIGH (ref 8.9–10.3)
Chloride: 104 mmol/L (ref 98–111)
Creatinine, Ser: 0.48 mg/dL — ABNORMAL LOW (ref 0.61–1.24)
GFR, Estimated: >60 mL/min (ref >60)
Glucose, Bld: 190 mg/dL — ABNORMAL HIGH (ref 70–99)
Potassium: 3.9 mmol/L (ref 3.5–5.1)
Sodium: 145 mmol/L (ref 135–145)

## 2020-08-24 LAB — CBC
HCT: 29.6 % — ABNORMAL LOW (ref 39.0–52.0)
Hemoglobin: 8.8 g/dL — ABNORMAL LOW (ref 13.0–17.0)
MCH: 28.6 pg (ref 26.0–34.0)
MCHC: 29.7 g/dL — ABNORMAL LOW (ref 30.0–36.0)
MCV: 96.1 fL (ref 80.0–100.0)
Platelets: 394 10*3/uL (ref 150–400)
RBC: 3.08 MIL/uL — ABNORMAL LOW (ref 4.22–5.81)
RDW: 19.6 % — ABNORMAL HIGH (ref 11.5–15.5)
WBC: 7.9 10*3/uL (ref 4.0–10.5)
nRBC: 0 % (ref 0.0–0.2)

## 2020-08-24 LAB — GLUCOSE, CAPILLARY
Glucose-Capillary: 203 mg/dL — ABNORMAL HIGH (ref 70–99)
Glucose-Capillary: 174 mg/dL — ABNORMAL HIGH (ref 70–99)
Glucose-Capillary: 168 mg/dL — ABNORMAL HIGH (ref 70–99)
Glucose-Capillary: 147 mg/dL — ABNORMAL HIGH (ref 70–99)
Glucose-Capillary: 158 mg/dL — ABNORMAL HIGH (ref 70–99)

## 2020-08-24 MED ORDER — INSULIN ASPART 100 UNIT/ML IJ SOLN
0.0000 [IU] | INTRAMUSCULAR | Status: DC
  Administered 2020-08-24 (×2): 4 [IU] via SUBCUTANEOUS
  Administered 2020-08-24: 3 [IU] via SUBCUTANEOUS
  Administered 2020-08-25: 7 [IU] via SUBCUTANEOUS
  Administered 2020-08-25: 3 [IU] via SUBCUTANEOUS
  Administered 2020-08-25 – 2020-08-26 (×5): 4 [IU] via SUBCUTANEOUS
  Administered 2020-08-26: 3 [IU] via SUBCUTANEOUS
  Administered 2020-08-26 (×2): 4 [IU] via SUBCUTANEOUS
  Administered 2020-08-26: 3 [IU] via SUBCUTANEOUS
  Administered 2020-08-26 – 2020-08-27 (×4): 4 [IU] via SUBCUTANEOUS
  Administered 2020-08-27: 3 [IU] via SUBCUTANEOUS

## 2020-08-24 NOTE — Progress Notes (Signed)
NAME:  Alex Rojas, MRN:  [id number], DOB:  05/06/1970, LOS: 4 ADMISSION DATE:  08/20/2020, CONSULTATION DATE: 5/18 REFERRING MD:  Dr. Manson Passey ([facility]), CHIEF COMPLAINT:  Refractory Hypoxia  History of Present Illness:  [name] is a 50 y.o. M who [facility] on 5/17 for refractory hypoxia.  He has a pertinent past medical history OSA, Tracheostomy (2022), Diastolic Left heart failure, DM, DKA, metabolic encephalopathy, sepsis post right groin infection s/p I&D, smoking history   Per chart review, Mr. [name] was [facility] for [facility] post hospital admission for sepsis from a right groin infection s/p I&D. Prior to his [facility] admission, He was extubated and re-intubated multiple times. He was trached in early April 2022 continued to require mechanical ventilation.     On 5/18 PCCM was consulted for further care after the patients FiO2 requirement had increased from 85% to 100%. CT scan concerning for bilateral ground glass opacities and infiltrates. Per Dr. Manson Passey he has remained persistently febrile through his admission, with [facility] ID following. He was being treated with Vancomycin, Meropenem, and Acyclovir.   Pt arrived to ICU completely unresponsive off sedation, per RN this was part of the reason for transfer.  It is unclear what his baseline has been.  Cardiology note from yesterday notes A&O x0  Pertinent  Medical History  OSA, Tracheostomy (2022), Diastolic Left heart failure, DM, DKA, metabolic encephalopathy, sepsis post right groin infection s/p I&D, smoking history   Significant Hospital Events: Including procedures, antibiotic start and stop dates in addition to other pertinent events   . April 2022-Tracheostomy . 4/11 Admit to [facility] . 5/12 TA- Psuedomonas, ECHO LVEF 60-65% RVSF normal . 5/18- Transfer from [facility]. PCCM consulted. On Vanc/Mero/Acyclovir PTA.  . 5/19 - narrowed to  meropenem . 5/21 new right PICC line, left-sided line removed  Interim History / Subjective:  0.50, PEEP 12, 6 cc/kg I/O- 9 L total    Objective   Blood pressure 132/73, pulse 87, temperature 99.9 F (37.7 C), temperature source Oral, resp. rate (!) 28, height 5\' 5"  (1.651 m), weight 110.2 kg, SpO2 93 %.    Vent Mode: PRVC FiO2 (%):  [50 %] 50 % Set Rate:  [28 bmp] 28 bmp Vt Set:  [370 mL] 370 mL PEEP:  [12 cmH20] 12 cmH20 Plateau Pressure:  [20 cmH20-22 cmH20] 20 cmH20   Intake/Output Summary (Last 24 hours) at 08/24/2020 0720 Last data filed at 08/24/2020 0500 Gross per 24 hour  Intake 2294.53 ml  Output 4160 ml  Net -1865.47 ml   Filed Weights   08/21/20 0500 08/22/20 0539 08/24/20 0500  Weight: 109.4 kg 106.2 kg 110.2 kg    Examination: General: Obese man, critically ill but stable, tolerating MV HEENT: Trach stoma clean and dry, oropharynx clear, pupils equal Neuro: Awake, eyes open, tracks, followed commands.  Moving his extremities spontaneously CV: Regular, distant, borderline tachycardic, no murmur PULM: Remains decreased at both bases, no wheezes, few scattered inspiratory crackles GI: Nondistended, positive bowel sounds Extremities: Anasarca, 1+ edema Skin: No rash  Labs/imaging that I havepersonally reviewed  (right click and "Reselect all SmartList Selections" daily)  All labs and studies reviewed 5/22 Sodium 145, intact renal function No leukocytosis Chest x-ray 5/22 new PICC in good position, improving bilateral interstitial infiltrates, improved left basilar atelectasis and effusion   Resolved Hospital Problem list     Assessment & Plan:    #Acute on Chronic Respiratory Failure  with Hypoxemia secondary to Pseudomonas pneumonia and volume overload with pleural effusions -Initially intubated in 06/2020 at OSH in the setting of septic shock and hx OSA, failed extubation several times before trach -CT scan with bilateral effusions and GGO's -TTE  5/12 LVEF 60-65%, no regional wall motion abnormalities, grade 1 diastolic dysfunction -Respiratory culture growing pseudomonas with resistance to ceftazidime, blood and urine cultures no growth.  No positive culture data since transfer -Some interval improvement with diuresis  P: -Continue meropenem started 5/18 (day 4 of 7) -Tolerating and benefiting from diuresis.  Plan to continue 5/22 as renal function and hemodynamics can tolerate.  Follow chest x-ray (improving) -Continue to wean PEEP and FiO2 -Guaifenesin for his secretions -VAP prevention order set and pulmonary hygiene -Question transition back to [facility] soon given his improving ventilator needs  #HFpEF Echo 5/12 with LVEF 60-65% and grade I diastolic dysfunction P: -Continue tight blood pressure control, amlodipine  #Metabolic Encephalopathy Reports of encepalopathy at [facility]. Unclear etiology - possibly 2/2 to hypoxia or infection. Unclear chronicity, though step-mother thinks that he was sedated at [facility] and minimally responsive for at least two weeks.  Head CT showed no acute abnormality. ABG showed mild acidemia and hypercarbia, but not high enough to explain encephalopathy. Ammonia level wnl.  Some interval improvement since transfer to ICU P: -Continue to minimize sedation -Correct any electrolyte or metabolic abnormalities -Continue to treat pneumonia as above  #Anemia Likely chronic iron deficiency anemia. Hgb improved today (8.6).  P: -Iron replacement  Hyperglycemia P: -Lantus 15 units twice daily -Sliding-scale insulin as ordered  #Fever -Pt has been persistently febrile throughout his admission at [facility]. Had no fevers overnight. May be improving with treatment of infection and diuresis. P: -Treating pneumonia as above -Following -TEE was deferred as no clear evidence to support endocarditis  #HTN P:  -Continue amlodipine  Best practice (right click and "Reselect all SmartList Selections" daily)   Diet:  Tube Feed  Pain/Anxiety/Delirium protocol (if indicated): Yes (RASS goal 0) VAP protocol (if indicated): Yes DVT prophylaxis: LMWH GI prophylaxis: H2B Glucose control:  SSI Yes Central venous access:  Yes, and it is still needed Arterial line:  N/A Foley:  Yes, and it is still needed Mobility:  bed rest  PT consulted: N/A Last date of multidisciplinary goals of care discussion [pending] Code Status:  full code Disposition: ICU   Independent CC time  minutes 31 minutes  [name], MD, PhD 08/24/2020, 7:20 AM [place] [facility] [phone] or if no answer before 7:00PM call [phone] For any issues after 7:00PM please call eLink [phone]

## 2020-08-24 NOTE — Progress Notes (Signed)
Consult received re PICC placement.  PICC placed by ECG technology with ideal P wave elevation obtained, no deflects.  Per April RN, pt without ectopy or dysrhythmias.  Recommend to leave PICC in current position per policy.

## 2020-08-24 NOTE — Progress Notes (Signed)
IV team consult placed to address PICC line; per radiology the PICC line needs to be withdrawn 3.4cm.

## 2020-08-25 DIAGNOSIS — J151 Pneumonia due to Pseudomonas: Secondary | ICD-10-CM | POA: Diagnosis not present

## 2020-08-25 DIAGNOSIS — R400 Somnolence: Secondary | ICD-10-CM | POA: Diagnosis not present

## 2020-08-25 DIAGNOSIS — I5031 Acute diastolic (congestive) heart failure: Secondary | ICD-10-CM | POA: Diagnosis not present

## 2020-08-25 DIAGNOSIS — J9611 Chronic respiratory failure with hypoxia: Secondary | ICD-10-CM | POA: Diagnosis not present

## 2020-08-25 LAB — BASIC METABOLIC PANEL
Anion gap: 8 (ref 5–15)
BUN: 35 mg/dL — ABNORMAL HIGH (ref 6–20)
CO2: 35 mmol/L — ABNORMAL HIGH (ref 22–32)
Calcium: 10.5 mg/dL — ABNORMAL HIGH (ref 8.9–10.3)
Chloride: 103 mmol/L (ref 98–111)
Creatinine, Ser: 0.55 mg/dL — ABNORMAL LOW (ref 0.61–1.24)
GFR, Estimated: >60 mL/min (ref >60)
Glucose, Bld: 157 mg/dL — ABNORMAL HIGH (ref 70–99)
Potassium: 3.8 mmol/L (ref 3.5–5.1)
Sodium: 146 mmol/L — ABNORMAL HIGH (ref 135–145)

## 2020-08-25 LAB — CBC
HCT: 31.5 % — ABNORMAL LOW (ref 39.0–52.0)
Hemoglobin: 9.2 g/dL — ABNORMAL LOW (ref 13.0–17.0)
MCH: 27.9 pg (ref 26.0–34.0)
MCHC: 29.2 g/dL — ABNORMAL LOW (ref 30.0–36.0)
MCV: 95.5 fL (ref 80.0–100.0)
Platelets: 399 10*3/uL (ref 150–400)
RBC: 3.3 MIL/uL — ABNORMAL LOW (ref 4.22–5.81)
RDW: 18.5 % — ABNORMAL HIGH (ref 11.5–15.5)
WBC: 7.3 10*3/uL (ref 4.0–10.5)
nRBC: 0 % (ref 0.0–0.2)

## 2020-08-25 LAB — GLUCOSE, CAPILLARY
Glucose-Capillary: 145 mg/dL — ABNORMAL HIGH (ref 70–99)
Glucose-Capillary: 152 mg/dL — ABNORMAL HIGH (ref 70–99)
Glucose-Capillary: 166 mg/dL — ABNORMAL HIGH (ref 70–99)
Glucose-Capillary: 170 mg/dL — ABNORMAL HIGH (ref 70–99)
Glucose-Capillary: 176 mg/dL — ABNORMAL HIGH (ref 70–99)
Glucose-Capillary: 199 mg/dL — ABNORMAL HIGH (ref 70–99)
Glucose-Capillary: 204 mg/dL — ABNORMAL HIGH (ref 70–99)

## 2020-08-25 MED ORDER — FUROSEMIDE 10 MG/ML IJ SOLN
40.0000 mg | Freq: Two times a day (BID) | INTRAMUSCULAR | Status: AC
  Administered 2020-08-25 – 2020-08-26 (×2): 40 mg via INTRAVENOUS
  Filled 2020-08-25 (×2): qty 4

## 2020-08-25 MED ORDER — HYDROCHLOROTHIAZIDE 25 MG PO TABS
25.0000 mg | ORAL_TABLET | Freq: Every day | ORAL | Status: DC
  Administered 2020-08-25 – 2020-08-27 (×3): 25 mg
  Filled 2020-08-25 (×3): qty 1

## 2020-08-25 MED ORDER — HYDROCHLOROTHIAZIDE 10 MG/ML ORAL SUSPENSION: 25.0000 mg | Freq: Every day | ORAL | Status: DC

## 2020-08-25 NOTE — Progress Notes (Addendum)
NAME:  Grey Rakestraw, MRN:  [id number], DOB:  1971-02-25, LOS: 5 ADMISSION DATE:  08/20/2020, CONSULTATION DATE: 5/18 REFERRING MD:  Dr. Manson Passey ([facility]), CHIEF COMPLAINT:  Refractory Hypoxia  History of Present Illness:  Mr. Zyshawn Bohnenkamp is a 50 y.o. M who [facility] on 5/17 for refractory hypoxia.  He has a pertinent past medical history OSA, Tracheostomy (2022), Diastolic Left heart failure, DM, DKA, metabolic encephalopathy, sepsis post right groin infection s/p I&D, smoking history   Per chart review, Mr. Salway was admitted to [facility] for [facility] post hospital admission for sepsis from a right groin infection s/p I&D. Prior to his [facility] admission, He was extubated and re-intubated multiple times. He was trached in early April 2022 continued to require mechanical ventilation.     On 5/18 PCCM was consulted for further care after the patients FiO2 requirement had increased from 85% to 100%. CT scan concerning for bilateral ground glass opacities and infiltrates. Per Dr. Manson Passey he has remained persistently febrile through his admission, with [facility] ID following. He was being treated with Vancomycin, Meropenem, and Acyclovir.   Pt arrived to ICU completely unresponsive off sedation, per RN this was part of the reason for transfer.  It is unclear what his baseline has been.  Cardiology note from yesterday notes A&O x0  Pertinent  Medical History  OSA, Tracheostomy (2022), Diastolic Left heart failure, DM, DKA, metabolic encephalopathy, sepsis post right groin infection s/p I&D, smoking history   Significant Hospital Events: Including procedures, antibiotic start and stop dates in addition to other pertinent events   . April 2022-Tracheostomy . 4/11 Admit to [facility] . 5/12 TA- Psuedomonas, ECHO LVEF 60-65% RVSF normal . 5/18- Transfer from [facility]. PCCM consulted. On Vanc/Mero/Acyclovir PTA.  . 5/19 - narrowed to  meropenem . 5/21 new right PICC line, left-sided line removed . 5/22 meropenem completed (7 days total)  Interim History / Subjective:   0.50, PEEP is down to 10.  60 cc/kg I/O- 10.9 L total More awake, more comfortable respiratory pattern    Objective   Blood pressure 130/78, pulse 87, temperature 98.4 F (36.9 C), temperature source Oral, resp. rate (!) 28, height 5\' 5"  (1.651 m), weight 94.9 kg, SpO2 96 %.    Vent Mode: PRVC FiO2 (%):  [50 %] 50 % Set Rate:  [28 bmp] 28 bmp Vt Set:  [370 mL] 370 mL PEEP:  [10 cmH20] 10 cmH20 Plateau Pressure:  [17 cmH20-19 cmH20] 18 cmH20   Intake/Output Summary (Last 24 hours) at 08/25/2020 0939 Last data filed at 08/25/2020 0801 Gross per 24 hour  Intake 1444.8 ml  Output 2325 ml  Net -880.2 ml   Filed Weights   08/22/20 0539 08/24/20 0500 08/25/20 0339  Weight: 106.2 kg 110.2 kg 94.9 kg    Examination: General: Obese man, critically ill but stable, tolerating MV HEENT: Trach stoma clean and dry, oropharynx clear, pupils equal Neuro: Awake, eyes open, tracks, followed commands.  Moving his extremities spontaneously CV: Regular, distant, borderline tachycardic, no murmur PULM: Remains decreased at both bases, no wheezes, few scattered inspiratory crackles GI: Nondistended, positive bowel sounds Extremities: Anasarca, 1+ edema Skin: No rash  Labs/imaging that I havepersonally reviewed  (right click and "Reselect all SmartList Selections" daily)   All labs and studies reviewed 5/23 Evolving hypernatremia Still no leukocytosis Intact renal function (being diuresed)   Resolved Hospital Problem list     Assessment & Plan:    #Acute  on Chronic Respiratory Failure with Hypoxemia secondary to Pseudomonas pneumonia and volume overload with pleural effusions -Initially intubated in 06/2020 at OSH in the setting of septic shock and hx OSA, failed extubation several times before trach -CT scan with bilateral effusions and  GGO's -TTE 5/12 LVEF 60-65%, no regional wall motion abnormalities, grade 1 diastolic dysfunction -Respiratory culture growing pseudomonas with resistance to ceftazidime, blood and urine cultures no growth.  No positive culture data since transfer -Some interval improvement with diuresis P: -Completed meropenem 5/22 (7 days, started at [facility] before transfer) -Vent needs have improved with diuresis and he is tolerating hemodynamically, tolerating from renal standpoint.  Plan to continue 5/23 -Repeat chest x-ray 5/24 -Continue to wean PEEP and FiO2 -Continue guaifenesin for his secretions -VAP prevention order set, pulmonary hygiene -Need to start preparing for transition back to LTAC.  Will discuss with TOC team on multidisciplinary rounds today 5/23  #HFpEF Echo 5/12 with LVEF 60-65% and grade I diastolic dysfunction P: -Continue tight blood pressure control.  Currently amlodipine 10 mg.  He has had some breakthrough periods of elevation.  And hydrochlorothiazide 5/23  #Metabolic Encephalopathy Reports of encepalopathy at [facility]. Unclear etiology - possibly 2/2 to hypoxia or infection. Unclear chronicity, though step-mother thinks that he was sedated at [facility] and minimally responsive for at least two weeks.  Head CT showed no acute abnormality. ABG showed mild acidemia and hypercarbia, but not high enough to explain encephalopathy. Ammonia level wnl.  Some interval improvement since transfer to ICU P: -Minimize sedation as able -Correct metabolic abnormalities -Continue treated pneumonia as above  #Anemia Likely chronic iron deficiency anemia. Hgb improved today (8.6).  P: -Continue iron replacement  Hyperglycemia P: -Continue Lantus 15 units twice daily -Sliding-scale insulin as ordered  #Fever -Pt has been persistently febrile throughout his admission at [facility]. Had no fevers overnight.  T-max 100.9 last 24 hours. P: -Treating pneumonia as above -Following clinically, fever  curve, WBC.  TEE was deferred at admission given no clear evidence to support endocarditis.  He had no positive blood cultures.  #HTN P:  -Amlodipine -Added HCTZ 5/23  #Hypernatremia P: -Add free water 5/23   Best practice (right click and "Reselect all SmartList Selections" daily)  Diet:  Tube Feed  Pain/Anxiety/Delirium protocol (if indicated): Yes (RASS goal 0) VAP protocol (if indicated): Yes DVT prophylaxis: LMWH GI prophylaxis: H2B Glucose control:  SSI Yes Central venous access:  Yes, and it is still needed Arterial line:  N/A Foley:  Yes, and it is still needed Mobility:  bed rest  PT consulted: N/A Last date of multidisciplinary goals of care discussion [pending] Code Status:  full code Disposition: ICU Family: updated patient's stepmother 5/23   Independent CC time  minutes 32 minutes  [name], MD, PhD 08/25/2020, 9:39 AM [place] [facility] [phone] or if no answer before 7:00PM call [phone] For any issues after 7:00PM please call eLink [phone]

## 2020-08-26 ENCOUNTER — Inpatient Hospital Stay (HOSPITAL_COMMUNITY): Payer: No Typology Code available for payment source

## 2020-08-26 DIAGNOSIS — J9611 Chronic respiratory failure with hypoxia: Secondary | ICD-10-CM | POA: Diagnosis not present

## 2020-08-26 DIAGNOSIS — I5031 Acute diastolic (congestive) heart failure: Secondary | ICD-10-CM | POA: Diagnosis not present

## 2020-08-26 DIAGNOSIS — J151 Pneumonia due to Pseudomonas: Secondary | ICD-10-CM | POA: Diagnosis not present

## 2020-08-26 LAB — GLUCOSE, CAPILLARY
Glucose-Capillary: 133 mg/dL — ABNORMAL HIGH (ref 70–99)
Glucose-Capillary: 162 mg/dL — ABNORMAL HIGH (ref 70–99)
Glucose-Capillary: 154 mg/dL — ABNORMAL HIGH (ref 70–99)
Glucose-Capillary: 181 mg/dL — ABNORMAL HIGH (ref 70–99)
Glucose-Capillary: 145 mg/dL — ABNORMAL HIGH (ref 70–99)
Glucose-Capillary: 181 mg/dL — ABNORMAL HIGH (ref 70–99)

## 2020-08-26 LAB — CBC
HCT: 31.8 % — ABNORMAL LOW (ref 39.0–52.0)
Hemoglobin: 9.4 g/dL — ABNORMAL LOW (ref 13.0–17.0)
MCH: 27.6 pg (ref 26.0–34.0)
MCHC: 29.6 g/dL — ABNORMAL LOW (ref 30.0–36.0)
MCV: 93.5 fL (ref 80.0–100.0)
Platelets: 414 10*3/uL — ABNORMAL HIGH (ref 150–400)
RBC: 3.4 MIL/uL — ABNORMAL LOW (ref 4.22–5.81)
RDW: 17.8 % — ABNORMAL HIGH (ref 11.5–15.5)
WBC: 10.6 10*3/uL — ABNORMAL HIGH (ref 4.0–10.5)
nRBC: 0 % (ref 0.0–0.2)

## 2020-08-26 LAB — BASIC METABOLIC PANEL
Anion gap: 9 (ref 5–15)
BUN: 35 mg/dL — ABNORMAL HIGH (ref 6–20)
CO2: 35 mmol/L — ABNORMAL HIGH (ref 22–32)
Calcium: 10.7 mg/dL — ABNORMAL HIGH (ref 8.9–10.3)
Chloride: 101 mmol/L (ref 98–111)
Creatinine, Ser: 0.61 mg/dL (ref 0.61–1.24)
GFR, Estimated: >60 mL/min (ref >60)
Glucose, Bld: 144 mg/dL — ABNORMAL HIGH (ref 70–99)
Potassium: 3.7 mmol/L (ref 3.5–5.1)
Sodium: 145 mmol/L (ref 135–145)

## 2020-08-26 LAB — MAGNESIUM: Magnesium: 2.1 mg/dL (ref 1.7–2.4)

## 2020-08-26 MED ORDER — POTASSIUM CHLORIDE 20 MEQ PO PACK
40.0000 meq | PACK | Freq: Once | ORAL | Status: AC
  Administered 2020-08-26: 40 meq
  Filled 2020-08-26: qty 2

## 2020-08-26 MED ORDER — FUROSEMIDE 10 MG/ML IJ SOLN
40.0000 mg | Freq: Two times a day (BID) | INTRAMUSCULAR | Status: AC
  Administered 2020-08-26 – 2020-08-27 (×2): 40 mg via INTRAVENOUS
  Filled 2020-08-26 (×2): qty 4

## 2020-08-26 NOTE — TOC Initial Note (Addendum)
Transition of Care (TOC) - Initial/Assessment Note    Patient Details  Name: Alex Rojas MRN: [id number] Date of Birth: 06/29/1970  Transition of Care (TOC) CM/SW Contact:    Debbie Swist, RN Phone Number: 08/26/2020, 10:23 AM  Clinical Narrative:              10:20 [facility] admitted from [facility].  Spoke w liaison Alex Rojas. [facility] is close to being medically stable to discharge back to [facility]ACH.  Please notify Alex, via secure chat or call [phone] when he approaches stability.  14:30 Spoke w bedside RN. She states that MD feels he is approaching stability. Met with patient at bedside who was fully awake, and nodding to questions (trached/ vent). He nodded yes to wanting to go back to [facility]. Notified Alex at [facility] to start auth.   15:00 Received call from [facility]. Patient is seen at the [place] location.  PCP- Dr Collias Social Worker- Alex Rojas- [phone] ext [phone]        Expected Discharge Plan: Long Term Acute Care (LTAC) Barriers to Discharge: Continued Medical Work up   Patient Goals and CMS Choice        Expected Discharge Plan and Services Expected Discharge Plan: Long Term Acute Care (LTAC)                                              Prior Living Arrangements/Services                       Activities of Daily Living      Permission Sought/Granted                  Emotional Assessment              Admission diagnosis:  AMS (altered mental status) [R41.82] Patient Active Problem List   Diagnosis Date Noted  . Chronic respiratory failure with hypoxia (HCC)   . AMS (altered mental status) 08/20/2020  . Pneumonia of both lower lobes due to Pseudomonas species (HCC)   . Acute diastolic heart failure (HCC)   . Acute on chronic respiratory failure with hypoxia (HCC)   . Severe sepsis (HCC)   .  Obstructive sleep apnea   . History of alcohol abuse   . Acute renal failure due to tubular necrosis (HCC)    PCP:  Brown, Stephanie Delores, MD Pharmacy:  No Pharmacies Listed    Social Determinants of Health (SDOH) Interventions    Readmission Risk Interventions No flowsheet data found.  

## 2020-08-26 NOTE — Progress Notes (Signed)
NAME:  Alex Rojas, MRN:  [id number], DOB:  07-Oct-1970, LOS: 6 ADMISSION DATE:  08/20/2020, CONSULTATION DATE: 5/18 REFERRING MD:  Dr. Manson Passey ([facility]), CHIEF COMPLAINT:  Refractory Hypoxia  History of Present Illness:  [name] is a 50 y.o. M who [facility] on 5/17 for refractory hypoxia.  He has a pertinent past medical history OSA, Tracheostomy (2022), Diastolic Left heart failure, DM, DKA, metabolic encephalopathy, sepsis post right groin infection s/p I&D, smoking history   Per chart review, [name] for [facility] post hospital admission for sepsis from a right groin infection s/p I&D. Prior to his [facility] admission, He was extubated and re-intubated multiple times. He was trached in early April 2022 continued to require mechanical ventilation.     On 5/18 PCCM was consulted for further care after the patients FiO2 requirement had increased from 85% to 100%. CT scan concerning for bilateral ground glass opacities and infiltrates. Per Dr. Manson Passey he has remained persistently febrile through his admission, with [facility] following. He was being treated with Vancomycin, Meropenem, and Acyclovir.   Pt arrived to ICU completely unresponsive off sedation, per RN this was part of the reason for transfer.  It is unclear what his baseline has been.  Cardiology note from yesterday notes A&O x0  Pertinent  Medical History  OSA, Tracheostomy (2022), Diastolic Left heart failure, DM, DKA, metabolic encephalopathy, sepsis post right groin infection s/p I&D, smoking history   Significant Hospital Events: Including procedures, antibiotic start and stop dates in addition to other pertinent events   . April 2022-Tracheostomy . 4/11 Admit to [facility] . 5/12 TA- Psuedomonas, ECHO LVEF 60-65% RVSF normal . 5/18- Transfer from [facility]. PCCM consulted. On Vanc/Mero/Acyclovir PTA.  . 5/19 - narrowed to  meropenem . 5/21 new right PICC line, left-sided line removed . 5/22 meropenem completed (7 days total)  Interim History / Subjective:   Significant improvement mental status over the last 48 hours 0.50, PEEP 8.  VT liberalized 8 cc/kg on 5/23 I/O -13 L total Meropenem completed 5/23    Objective   Blood pressure 116/77, pulse 93, temperature 99.3 F (37.4 C), temperature source Axillary, resp. rate 20, height 5\' 5"  (1.651 m), weight 93 kg, SpO2 93 %.    Vent Mode: PRVC FiO2 (%):  [50 %] 50 % Set Rate:  [20 bmp-28 bmp] 20 bmp Vt Set:  [370 mL-490 mL] 490 mL PEEP:  [8 cmH20-10 cmH20] 8 cmH20 Plateau Pressure:  [16 cmH20-19 cmH20] 16 cmH20   Intake/Output Summary (Last 24 hours) at 08/26/2020 0732 Last data filed at 08/26/2020 0600 Gross per 24 hour  Intake 1450 ml  Output 3575 ml  Net -2125 ml   Filed Weights   08/24/20 0500 08/25/20 0339 08/26/20 0405  Weight: 110.2 kg 94.9 kg 93 kg    Examination: General: Obese man, on MV, tolerating HEENT: Trach site okay, clean, oropharynx clear Neuro: Awake, eyes open, nods to questions, more active, interacting more effectively.  Moves all extremities CV: Distant, regular, no murmur PULM: Decreased at the bases, no wheezing, no crackles GI: Nondistended, positive bowel sounds Extremities: Trace lower extremity edema Skin: No rash Foley in place  Labs/imaging that I havepersonally reviewed  (right click and "Reselect all SmartList Selections" daily)   All labs and studies reviewed 5/24 Sodium stable 145    Resolved Hospital Problem list     Assessment & Plan:    #Acute  on Chronic Respiratory Failure with Hypoxemia secondary to Pseudomonas pneumonia and volume overload with pleural effusions -Initially intubated in 06/2020 at OSH in the setting of septic shock and hx OSA, failed extubation several times before trach -CT scan with bilateral effusions and GGO's -TTE 5/12 LVEF 60-65%, no regional wall motion  abnormalities, grade 1 diastolic dysfunction -Respiratory culture growing pseudomonas with resistance to ceftazidime, blood and urine cultures no growth.  No positive culture data since transfer -Some interval improvement with diuresis P: -7 days meropenem completed -Vent needs improving, continue to wean FiO2.  Question whether we may be able start working towards pressure support ventilation.  Suspect this will be prolonged and he will need to go back to LTAC in order to accomplish.  Discussed with TOC on 5/23 -Follow chest x-ray -Continue guaifenesin -Diuresis as his blood pressure and renal function tolerate.  He will need maintenance diuretic regimen ultimately.  #HFpEF Echo 5/12 with LVEF 60-65% and grade I diastolic dysfunction P: -Tight blood pressure control with amlodipine 10 mg, hydrochlorothiazide added 5/23  #Metabolic Encephalopathy Reports of encepalopathy at [facility]. Unclear etiology - possibly 2/2 to hypoxia or infection. Unclear chronicity, though step-mother thinks that he was sedated at [facility] and minimally responsive for at least two weeks.  Head CT showed no acute abnormality. ABG showed mild acidemia and hypercarbia, but not high enough to explain encephalopathy. Ammonia level wnl.  Some interval improvement since transfer to ICU P: -Improving.  Minimize sedation as able -Continue to correct metabolic abnormalities. -Pneumonia treatment completed  #Anemia Likely chronic iron deficiency anemia. Hgb improved today (8.6).  P: -Iron replacement  Hyperglycemia P: -Lantus 15 units twice daily -Sliding-scale insulin as ordered  #Fever -Pt has been persistently febrile throughout his admission at [facility]. Had no fevers overnight.  T-max 100.9 last 24 hours. P: -Improved with treatment of pneumonia -Following clinically, WBC, fever curve.  No indication for TEE.  He has had no positive blood cultures  #HTN P:  -Amlodipine and HCTZ  #Hypernatremia P: -Continue  free water.  May need to adjust given diuretics.   Best practice (right click and "Reselect all SmartList Selections" daily)  Diet:  Tube Feed  Pain/Anxiety/Delirium protocol (if indicated): Yes (RASS goal 0) VAP protocol (if indicated): Yes DVT prophylaxis: LMWH GI prophylaxis: H2B Glucose control:  SSI Yes Central venous access:  Yes, and it is still needed Arterial line:  N/A Foley:  Yes, and it is still needed Mobility:  bed rest  PT consulted: N/A Last date of multidisciplinary goals of care discussion [pending] Code Status:  full code Disposition: ICU Family: updated patient's stepmother 5/23   Independent CC time  minutes 31 minutes  [name], MD, PhD 08/26/2020, 7:32 AM [facility] [phone] or if no answer before 7:00PM call [phone] For any issues after 7:00PM please call eLink [phone]

## 2020-08-26 NOTE — Progress Notes (Signed)
RT NOTE: attempted SBT on patient this AM on CPAP/PSV of 15/8.  Patient went apneic and patient placed back on full support ventilator settings.  Tolerating well at this time.  Will continue to monitor.

## 2020-08-26 NOTE — Progress Notes (Signed)
Pharmacy Electrolyte Replacement  Recent Labs:  Recent Labs    08/26/20 0405  K 3.7  MG 2.1  CREATININE 0.61    Low Critical Values (K </= 2.5, Phos </= 1, Mg </= 1) Present: None  MD Contacted: n/a  Plan: KCl 40 mEq per tube x1 dose per protocol.   Link Snuffer, PharmD, BCPS, BCCCP Clinical Pharmacist Please refer to [facility] for [facility] Pharmacy numbers 08/26/2020, 7:40 AM

## 2020-08-27 ENCOUNTER — Other Ambulatory Visit (HOSPITAL_COMMUNITY): Payer: No Typology Code available for payment source

## 2020-08-27 ENCOUNTER — Institutional Professional Consult (permissible substitution)
Admission: RE | Admit: 2020-08-27 | Discharge: 2020-09-26 | Disposition: A | Discharge status: Skilled Nursing Facility | Payer: No Typology Code available for payment source | Attending: Internal Medicine | Admitting: Internal Medicine

## 2020-08-27 DIAGNOSIS — Z931 Gastrostomy status: Secondary | ICD-10-CM

## 2020-08-27 DIAGNOSIS — R400 Somnolence: Secondary | ICD-10-CM | POA: Diagnosis not present

## 2020-08-27 DIAGNOSIS — J9621 Acute and chronic respiratory failure with hypoxia: Secondary | ICD-10-CM | POA: Diagnosis present

## 2020-08-27 DIAGNOSIS — F1011 Alcohol abuse, in remission: Secondary | ICD-10-CM | POA: Diagnosis present

## 2020-08-27 DIAGNOSIS — J189 Pneumonia, unspecified organism: Secondary | ICD-10-CM

## 2020-08-27 DIAGNOSIS — A419 Sepsis, unspecified organism: Secondary | ICD-10-CM | POA: Diagnosis present

## 2020-08-27 DIAGNOSIS — I5031 Acute diastolic (congestive) heart failure: Secondary | ICD-10-CM | POA: Diagnosis not present

## 2020-08-27 DIAGNOSIS — J9611 Chronic respiratory failure with hypoxia: Secondary | ICD-10-CM | POA: Diagnosis not present

## 2020-08-27 DIAGNOSIS — J811 Chronic pulmonary edema: Secondary | ICD-10-CM

## 2020-08-27 DIAGNOSIS — J151 Pneumonia due to Pseudomonas: Secondary | ICD-10-CM | POA: Diagnosis not present

## 2020-08-27 LAB — CBC
HCT: 32.9 % — ABNORMAL LOW (ref 39.0–52.0)
Hemoglobin: 9.8 g/dL — ABNORMAL LOW (ref 13.0–17.0)
MCH: 27.8 pg (ref 26.0–34.0)
MCHC: 29.8 g/dL — ABNORMAL LOW (ref 30.0–36.0)
MCV: 93.5 fL (ref 80.0–100.0)
Platelets: 419 10*3/uL — ABNORMAL HIGH (ref 150–400)
RBC: 3.52 MIL/uL — ABNORMAL LOW (ref 4.22–5.81)
RDW: 17.8 % — ABNORMAL HIGH (ref 11.5–15.5)
WBC: 10.5 10*3/uL (ref 4.0–10.5)
nRBC: 0 % (ref 0.0–0.2)

## 2020-08-27 LAB — BASIC METABOLIC PANEL
Anion gap: 8 (ref 5–15)
BUN: 37 mg/dL — ABNORMAL HIGH (ref 6–20)
CO2: 33 mmol/L — ABNORMAL HIGH (ref 22–32)
Calcium: 10.7 mg/dL — ABNORMAL HIGH (ref 8.9–10.3)
Chloride: 102 mmol/L (ref 98–111)
Creatinine, Ser: 0.69 mg/dL (ref 0.61–1.24)
GFR, Estimated: >60 mL/min (ref >60)
Glucose, Bld: 159 mg/dL — ABNORMAL HIGH (ref 70–99)
Potassium: 3.9 mmol/L (ref 3.5–5.1)
Sodium: 143 mmol/L (ref 135–145)

## 2020-08-27 LAB — GLUCOSE, CAPILLARY
Glucose-Capillary: 159 mg/dL — ABNORMAL HIGH (ref 70–99)
Glucose-Capillary: 151 mg/dL — ABNORMAL HIGH (ref 70–99)
Glucose-Capillary: 185 mg/dL — ABNORMAL HIGH (ref 70–99)
Glucose-Capillary: 134 mg/dL — ABNORMAL HIGH (ref 70–99)

## 2020-08-27 LAB — MAGNESIUM: Magnesium: 2.3 mg/dL (ref 1.7–2.4)

## 2020-08-27 MED ORDER — HYDROCHLOROTHIAZIDE 25 MG PO TABS: 25.0000 mg | ORAL_TABLET | Freq: Every day | ORAL | Status: AC

## 2020-08-27 MED ORDER — FUROSEMIDE 40 MG PO TABS: 40.0000 mg | ORAL_TABLET | Freq: Two times a day (BID) | ORAL | Status: AC

## 2020-08-27 MED ORDER — VITAL 1.5 CAL PO LIQD: 1000.0000 mL | ORAL | Status: AC

## 2020-08-27 MED ORDER — POTASSIUM CHLORIDE 20 MEQ PO PACK
20.0000 meq | PACK | Freq: Every day | ORAL | Status: AC
Start: 2020-08-28 — End: ?

## 2020-08-27 MED ORDER — DOCUSATE SODIUM 50 MG/5ML PO LIQD: 100.0000 mg | Freq: Two times a day (BID) | ORAL | Status: DC | PRN

## 2020-08-27 MED ORDER — PROSOURCE TF PO LIQD: 45.0000 mL | Freq: Two times a day (BID) | ORAL | Status: AC

## 2020-08-27 MED ORDER — POLYETHYLENE GLYCOL 3350 17 G PO PACK: 17.0000 g | PACK | Freq: Every day | ORAL | Status: DC | PRN

## 2020-08-27 MED ORDER — FERROUS SULFATE 300 (60 FE) MG/5ML PO SYRP: 300.0000 mg | ORAL_SOLUTION | Freq: Every day | ORAL | 3 refills | Status: AC

## 2020-08-27 MED ORDER — INSULIN ASPART 100 UNIT/ML IJ SOLN: 0.0000 [IU] | INTRAMUSCULAR | 11 refills | Status: AC

## 2020-08-27 MED ORDER — GUAIFENESIN 100 MG/5ML PO SOLN: 5.0000 mL | Freq: Four times a day (QID) | ORAL | 0 refills | Status: AC

## 2020-08-27 MED ORDER — INSULIN GLARGINE 100 UNIT/ML ~~LOC~~ SOLN: 15.0000 [IU] | Freq: Two times a day (BID) | SUBCUTANEOUS | 11 refills | Status: AC

## 2020-08-27 MED ORDER — FUROSEMIDE 40 MG PO TABS
40.0000 mg | ORAL_TABLET | Freq: Two times a day (BID) | ORAL | Status: DC
  Administered 2020-08-27: 40 mg
  Filled 2020-08-27: qty 1

## 2020-08-27 MED ORDER — DOCUSATE SODIUM 50 MG/5ML PO LIQD: 100.0000 mg | Freq: Two times a day (BID) | ORAL | 0 refills | Status: AC | PRN

## 2020-08-27 MED ORDER — LACTULOSE 10 GM/15ML PO SOLN: 10.0000 g | Freq: Every day | ORAL | 0 refills | Status: AC

## 2020-08-27 MED ORDER — ENOXAPARIN SODIUM 60 MG/0.6ML IJ SOSY: 50.0000 mg | PREFILLED_SYRINGE | INTRAMUSCULAR | Status: AC

## 2020-08-27 MED ORDER — POTASSIUM CHLORIDE 20 MEQ PO PACK
20.0000 meq | PACK | Freq: Every day | ORAL | Status: DC
  Administered 2020-08-27: 20 meq
  Filled 2020-08-27: qty 1

## 2020-08-27 MED ORDER — DIATRIZOATE MEGLUMINE & SODIUM 66-10 % PO SOLN: 30.0000 mL | Freq: Once | ORAL | Status: DC

## 2020-08-27 MED ORDER — FAMOTIDINE 20 MG PO TABS: 20.0000 mg | ORAL_TABLET | Freq: Two times a day (BID) | ORAL | Status: AC

## 2020-08-27 MED ORDER — AMLODIPINE BESYLATE 10 MG PO TABS: 10.0000 mg | ORAL_TABLET | Freq: Every day | ORAL | Status: AC

## 2020-08-27 MED ORDER — FUROSEMIDE 8 MG/ML PO SOLN: 40.0000 mg | Freq: Two times a day (BID) | ORAL | Status: DC

## 2020-08-27 NOTE — TOC Transition Note (Signed)
Transition of Care [facility]) - CM/SW Discharge Note   Patient Details  Name: Alex Rojas MRN: [id number] Date of Birth: 11-25-1970  Transition of [facility] CM/SW Contact:  Lawerance Sabal, RN Phone Number: 08/27/2020, 1:18 PM   Clinical Narrative:     [facility] has arranged for admission today with Dr Delton Coombes.  [facility] to [facility] to [facility]. Admitting MD Dr Carron Curie Nurse to call report to [phone].    Final next level of care: Long Term Acute Care (LTAC) Barriers to Discharge: No Barriers Identified   Patient Goals and CMS Choice        Discharge Placement                       Discharge Plan and Services                                     Social Determinants of Health (SDOH) Interventions     Readmission Risk Interventions No flowsheet data found.

## 2020-08-27 NOTE — Progress Notes (Signed)
[facility] transferred to [facility] rm [phone]. Report called prior to transport; VSS at this time; RN, Resp Therapy, and NA present during transfer; Step mother called LVM.

## 2020-08-27 NOTE — Progress Notes (Signed)
NAME:  Alex Rojas, MRN:  [id number], DOB:  20-Feb-1971, LOS: 7 ADMISSION DATE:  08/20/2020, CONSULTATION DATE: 5/18 REFERRING MD:  Dr. Manson Passey ([facility]), CHIEF COMPLAINT:  Refractory Hypoxia  History of Present Illness:  Alex Rojas is a 50 y.o. M who [facility] on 5/17 for refractory hypoxia.  He has a pertinent past medical history OSA, Tracheostomy (2022), Diastolic Left heart failure, DM, DKA, metabolic encephalopathy, sepsis post right groin infection s/p I&D, smoking history   Per chart review, Alex Rojas [facility] for [facility] post hospital admission for sepsis from a right groin infection s/p I&D. Prior to his [facility] admission, He was extubated and re-intubated multiple times. He was trached in early April 2022 continued to require mechanical ventilation.     On 5/18 PCCM was consulted for further care after the patients FiO2 requirement had increased from 85% to 100%. CT scan concerning for bilateral ground glass opacities and infiltrates. Per Dr. Manson Passey he has remained persistently febrile through his admission, with [facility] following. He was being treated with Vancomycin, Meropenem, and Acyclovir.   Pt arrived to ICU completely unresponsive off sedation, per RN this was part of the reason for transfer.  It is unclear what his baseline has been.  Cardiology note from yesterday notes A&O x0  Pertinent  Medical History  OSA, Tracheostomy (2022), Diastolic Left heart failure, DM, DKA, metabolic encephalopathy, sepsis post right groin infection s/p I&D, smoking history   Significant Hospital Events: Including procedures, antibiotic start and stop dates in addition to other pertinent events   . April 2022-Tracheostomy . 4/11 Admit to [facility] . 5/12 TA- Psuedomonas, ECHO LVEF 60-65% RVSF normal . 5/18- Transfer from [facility]. PCCM consulted. On Vanc/Mero/Acyclovir PTA.  . 5/19 - narrowed to  meropenem . 5/21 new right PICC line, left-sided line removed . 5/22 meropenem completed (7 days total)  Interim History / Subjective:   Comfortable, no problems noted overnight Continues to have good diuresis, tolerating Appreciate TOC assistance regarding possible transition back to LTAC 0.50, PEEP 8, 8 cc/kg I/O- 14.7 L total  Objective   Blood pressure 117/75, pulse (!) 103, temperature 97.9 F (36.6 C), temperature source Axillary, resp. rate 20, height 5\' 5"  (1.651 m), weight 93 kg, SpO2 92 %.    Vent Mode: PRVC FiO2 (%):  [50 %] 50 % Set Rate:  [20 bmp] 20 bmp Vt Set:  [490 mL] 490 mL PEEP:  [8 cmH20] 8 cmH20 Plateau Pressure:  [18 cmH20-21 cmH20] 18 cmH20   Intake/Output Summary (Last 24 hours) at 08/27/2020 0803 Last data filed at 08/27/2020 0800 Gross per 24 hour  Intake 1054.86 ml  Output 2760 ml  Net -1705.14 ml   Filed Weights   08/24/20 0500 08/25/20 0339 08/26/20 0405  Weight: 110.2 kg 94.9 kg 93 kg    Examination: General: Obese man debilitated, comfortable, laying in bed on MV HEENT: Oropharynx clear, trach site clean and dry without secretions Neuro: Awake, nods to questions, follows commands.  Some impulsive behavior but overall calm CV: Distant, regular, no murmur PULM: Decreased at both bases, no wheeze or crackles GI: Nondistended, positive bowel sounds Extremities: No edema Skin: No rash Foley catheter in place  Labs/imaging that I havepersonally reviewed  (right click and "Reselect all SmartList Selections" daily)  All labs and studies reviewed 5/25  Potassium 3.9 CO2 stable at 33 Renal function normal   Resolved Hospital Problem list   Pseudomonas  healthcare associated pneumonia  Assessment & Plan:    #Acute on Chronic Respiratory Failure with Hypoxemia secondary to Pseudomonas pneumonia and volume overload with pleural effusions -Initially intubated in 06/2020 at OSH in the setting of septic shock and hx OSA, failed extubation  several times before trach -CT scan 5/17 with bilateral effusions and GGO's -TTE 5/12 LVEF 60-65%, no regional wall motion abnormalities, grade 1 diastolic dysfunction -Respiratory culture growing pseudomonas with resistance to ceftazidime, blood and urine cultures no growth.  No positive culture data since transfer -Interval improvement with diuresis, improving vent requirements P: -Completed 7 days meropenem -Continue diuresis and transition to scheduled furosemide -Now in a position to start spontaneous breathing trials.  He will likely require an extended ventilator wean.  Will need to go back to LTAC in order to accomplish.  We are working on process to go back to [facility] and appreciate TOC assistance -Continue guaifenesin  #HFpEF Echo 5/12 with LVEF 60-65% and grade I diastolic dysfunction P: -Continue tight blood pressure control with amlodipine 10 mg, hydrochlorothiazide added 5/23.  No adjustments 5/25  #Metabolic Encephalopathy Reports of encepalopathy at [facility]. Unclear etiology - possibly 2/2 to hypoxia or infection or chronic sedation Unclear chronicity, though step-mother thinks that he was sedated at [facility] and minimally responsive for at least two weeks.  Head CT on this admission showed no acute abnormality. ABG showed mild acidemia and hypercarbia, but not high enough to explain encephalopathy. Ammonia level wnl.  Some interval improvement since transfer to ICU P: -Improved.  Working to minimize sedating medications -Metabolic abnormalities and pseudomonal pneumonia treated  #Anemia Likely chronic iron deficiency anemia. Hgb improved today (8.6).  P: Continue iron replacement  Hyperglycemia P: Continue Lantus 15 units twice daily -Sliding scale insulin as ordered  #Fever -Pt has been persistently febrile throughout his admission at [facility].  Improved P: -Improved with treatment of pneumonia -Continue to follow clinically, WBC, fever curve.  No indication for TEE.   He has had no positive blood cultures  #HTN P:  -Continue amlodipine, HCTZ current doses  #Hypernatremia, improved P: -Free water discontinued.  Add back depending on sodium especially on chronic diuretics.   Best practice (right click and "Reselect all SmartList Selections" daily)  Diet:  Tube Feed  Pain/Anxiety/Delirium protocol (if indicated): Yes (RASS goal 0) VAP protocol (if indicated): Yes DVT prophylaxis: LMWH GI prophylaxis: H2B Glucose control:  SSI Yes Central venous access:  Yes, and it is still needed Arterial line:  N/A Foley:  Yes, and it is still needed Mobility:  bed rest  PT consulted: N/A Last date of multidisciplinary goals of care discussion [pending] Code Status:  full code Disposition: ICU Family: updated patient's stepmother 5/25     [name], MD, PhD 08/27/2020, 8:03 AM [facility] [phone] or if no answer before 7:00PM call [phone] For any issues after 7:00PM please call eLink [phone]

## 2020-08-27 NOTE — Discharge Summary (Signed)
Physician Discharge Summary         Patient ID: Erlene Sentersndrew Murray Kettlewell MRN: [id number] DOB/AGE: 1971/01/24 50 y.o.  Admit date: 08/20/2020 Discharge date: 08/27/2020  Discharge Diagnoses:   -Acute on Chronic Respiratory Failure with Hypoxemia secondary to Pseudomonas pneumonia and volume overload with pleural effusions -HFpEF -Metabolic Encephalopathy -Anemia -Hyperglycemia -Fever -HTN -Hypernatremia, improved   Discharge summary    Mr. Erlene Sentersndrew Murray Kallam is a 50 y.o. M who [facility] for refractory hypoxia, fevers.  He has a pertinent past medical history OSA, Tracheostomy (2022), Diastolic Left heart failure, DM, DKA, metabolic encephalopathy, sepsis post right groin infection s/p I&D, smoking history   Per chart review, Mr. Eddie CandleCummings [facility] for [facility] post hospital admission for sepsis from a right groin infection s/p I&D. Prior to his [facility] admission, He was extubated and re-intubated multiple times. He was trached in early April 2022 continued to require mechanical ventilation.     On 5/18 PCCM was consulted for further care after the patients FiO2 requirement had increased from 85% to 100%. CT scan concerning for bilateral ground glass opacities and infiltrates. Per Dr. Manson PasseyBrown he has remained persistently febrile through his admission, with [facility] following. He was being treated empirically with Vancomycin, Meropenem, and Acyclovir.   Pt arrived to ICU completely unresponsive off sedation, per RN this was part of the reason for transfer. His step mother confirmed that he has been encephalopathic for the last 2 weeks, in large part since arrival to [facility].  On admission he required high PEEP and high FiO2.  His vancomycin and acyclovir were discontinued and he was continued on meropenem to complete 7 days total to treat Pseudomonas bronchitis/pneumonia.  All sedating  medications were held.  He was aggressively diuresed given the concern for alveolar infiltrates and bilateral pleural effusions on chest imaging.  Suspect there was a significant component of total body volume overload.  At the time of discharge he was -14.7 L and he will continue on maintenance Lasix per tube.   Discharge Plan by Active Problems    #Acute on Chronic Respiratory Failure with Hypoxemia secondary to Pseudomonas pneumonia and volume overload with pleural effusions -Initially intubated in 06/2020 at OSH in the setting of septic shock and hx OSA, failed extubation several times before trach -CT scan 5/17 with bilateral effusions and GGO's -TTE 5/12 LVEF 60-65%, no regional wall motion abnormalities, grade 1 diastolic dysfunction -Respiratory culture growing pseudomonas with resistance to ceftazidime, blood and urine cultures no growth.  No positive culture data since transfer -Interval improvement with diuresis, improving vent requirements P: -Completed 7 days meropenem -Continue diuresis and transition to scheduled furosemide: 40 mg per tube twice daily -Now in a position to start spontaneous breathing trials.  He will likely require an extended ventilator wean.  Will need to go back to LTAC in order to accomplish.  Planning [facility] -Continue guaifenesin.  This has helped with his secretion management  #HFpEF Echo 5/12 with LVEF 60-65% and grade I diastolic dysfunction P: -Continue tight blood pressure control with amlodipine 10 mg, hydrochlorothiazide 25 mg daily  #Metabolic Encephalopathy Reports of encepalopathy at time of original transfer to [facility]. Unclear etiology - possibly 2/2 to hypoxia or infection or chronic sedation. Head CT on this admission showed no acute abnormality. ABG showed mild acidemia and hypercarbia, but not high enough to explain encephalopathy. Ammonia level wnl. P: -Improved.  Working to minimize sedating  medications -Metabolic abnormalities and pseudomonal pneumonia treated  #Anemia Likely chronic iron deficiency anemia. Hgb improved P: Continue iron replacement  Hyperglycemia P: -Continue Lantus 15 units twice daily -Sliding scale insulin as ordered  #Fever -Pt has been persistently febrile throughout his admission at [facility].  Improved P: -Improved with treatment of pneumonia -Continue to follow clinically, WBC, fever curve.  No indication for TEE.  He has had no positive blood cultures  #HTN P:  -Continue amlodipine, HCTZ current doses  #Hypernatremia, improved P: -Free water discontinued.  Add back depending on sodium trend especially on chronic diuretics   Significant Hospital tests/ studies   [facility]al [facility]ate from [facility] 08/14/2020 showed Pseudomonas  Head CT 08/20/2020 showed no acute intracranial abnormality, some mild baseline parenchymal atrophy, bilateral mastoid and middle ear effusions  Echocardiogram 08/14/2020 done at [facility], LVEF 60-65% with grade 1 diastolic dysfunction, intact RV function and normal RV size.  Procedures   Right PICC line placed 5/21 and left-sided line removed.  Culture data/antimicrobials   Completed 7 days total meropenem (first 2 days were at [facility]) MRSA screen -5/18 negative   Consults  None     Discharge Exam: BP (!) 128/91 (BP Location: Left Arm)   Pulse (!) 103   Temp 97.8 F (36.6 C) (Axillary)   Resp 15   Ht 5\' 5"  (1.651 m)   Wt 93 kg   SpO2 93%   BMI 34.12 kg/m   General: Obese man debilitated, comfortable, laying in bed on MV HEENT: Oropharynx clear, trach site clean and dry without secretions Neuro: Awake, nods to questions, follows commands.  Some impulsive behavior but overall calm CV: Distant, regular, no murmur PULM: Decreased at both bases, no wheeze or crackles GI: Nondistended, positive bowel sounds Extremities: No edema Skin: No rash Foley catheter in place   Labs at  discharge   Lab Results  Component Value Date   CREATININE 0.69 08/27/2020   BUN 37 (H) 08/27/2020   NA 143 08/27/2020   K 3.9 08/27/2020   CL 102 08/27/2020   CO2 33 (H) 08/27/2020   Lab Results  Component Value Date   WBC 10.5 08/27/2020   HGB 9.8 (L) 08/27/2020   HCT 32.9 (L) 08/27/2020   MCV 93.5 08/27/2020   PLT 419 (H) 08/27/2020   Lab Results  Component Value Date   ALT 12 08/07/2020   AST 25 08/07/2020   ALKPHOS 60 08/07/2020   BILITOT 0.3 08/07/2020   No results found for: INR, PROTIME  Current radiological studies    DG Chest Port 1 View  Result Date: 08/26/2020 CLINICAL DATA:  Acute on chronic respiratory failure.  Hypoxia. EXAM: PORTABLE CHEST 1 VIEW COMPARISON:  08/24/2020. FINDINGS: Tracheostomy tube in stable position. Right PICC line noted with tip over right atrium. Proximal repositioning of approximately 8 cm should be considered. Stable cardiomegaly. Persistent bibasilar infiltrates/edema. Small left pleural effusion cannot be excluded. No pneumothorax. IMPRESSION: 1. Tracheostomy tube in stable position. Right PICC line noted with its tip over the right atrium. Proximal repositioning of approximately 8 cm should be considered. 2. Persistent bibasilar pulmonary infiltrates/edema. Small left pleural effusion cannot be excluded. 3.  Stable cardiomegaly. Electronically Signed   By: 08/26/2020  Register   On: 08/26/2020 06:03    Disposition:    Discharge disposition: [facility]-Another Health Care Institution Not Defined         Allergies as of 08/27/2020   Not on File     Medication  List    STOP taking these medications   acetylcysteine 20 % nebulizer solution Commonly known as: MUCOMYST   acyclovir 50 MG/ML injection Commonly known as: ZOVIRAX   albuterol (2.5 MG/3ML) 0.083% nebulizer solution Commonly known as: PROVENTIL   cholecalciferol 25 MCG (1000 UNIT) tablet Commonly known as: VITAMIN D3   Digestive Advantage Caps   feeding supplement  (PRO-STAT 64) Liqd   fiber Pack packet   folic acid 1 MG tablet Commonly known as: FOLVITE   furosemide 10 mg/mL Soln Commonly known as: LASIX Replaced by: furosemide 40 MG tablet   insulin lispro 100 UNIT/ML injection Commonly known as: HUMALOG   melatonin 3 MG Tabs tablet   meropenem  IVPB Commonly known as: MERREM   methadone 10 MG tablet Commonly known as: DOLOPHINE   multivitamins with iron Tabs tablet   nicotine 7 mg/24hr patch Commonly known as: NICODERM CQ - dosed in mg/24 hr   nutrition supplement (JUVEN) Pack Replaced by: feeding supplement (PROSource TF) liquid   pantoprazole sodium 40 mg/20 mL Pack Commonly known as: PROTONIX   sodium chloride 0.9 % infusion   thiamine 100 MG tablet   vancomycin 1,000 mg in sodium chloride 0.9 % 100 mL     TAKE these medications   amLODipine 10 MG tablet Commonly known as: NORVASC Place 1 tablet (10 mg total) into feeding tube daily. Start taking on: Aug 28, 2020 What changed:   medication strength  how to take this   docusate 50 MG/5ML liquid Commonly known as: COLACE Place 10 mLs (100 mg total) into feeding tube 2 (two) times daily as needed for mild constipation.   enoxaparin 60 MG/0.6ML injection Commonly known as: LOVENOX Inject 0.5 mLs (50 mg total) into the skin daily.   famotidine 20 MG tablet Commonly known as: PEPCID Place 1 tablet (20 mg total) into feeding tube 2 (two) times daily.   feeding supplement (PROSource TF) liquid Place 45 mLs into feeding tube 2 (two) times daily. Replaces: nutrition supplement (JUVEN) Pack   feeding supplement (VITAL 1.5 CAL) Liqd Place 1,000 mLs into feeding tube continuous.   ferrous sulfate 300 (60 Fe) MG/5ML syrup Place 5 mLs (300 mg total) into feeding tube daily. Start taking on: Aug 28, 2020   furosemide 40 MG tablet Commonly known as: LASIX Place 1 tablet (40 mg total) into feeding tube 2 (two) times daily. Replaces: furosemide 10 mg/mL Soln    guaiFENesin 100 MG/5ML Soln Commonly known as: ROBITUSSIN Place 5 mLs (100 mg total) into feeding tube every 6 (six) hours.   hydrochlorothiazide 25 MG tablet Commonly known as: HYDRODIURIL Place 1 tablet (25 mg total) into feeding tube daily. Start taking on: Aug 28, 2020   insulin aspart 100 UNIT/ML injection Commonly known as: novoLOG Inject 0-20 Units into the skin every 4 (four) hours.   insulin glargine 100 UNIT/ML injection Commonly known as: LANTUS Inject 0.15 mLs (15 Units total) into the skin 2 (two) times daily. What changed: how much to take   lactulose 10 GM/15ML solution Commonly known as: CHRONULAC Place 15 mLs (10 g total) into feeding tube daily. Start taking on: Aug 28, 2020 What changed: how to take this   potassium chloride 20 MEQ packet Commonly known as: KLOR-CON Place 20 mEq into feeding tube daily. Start taking on: Aug 28, 2020        Follow-up appointment   To be determined once he is ready to be discharged from [facility]  Discharge Condition:  stable  Physician Statement:   The Patient was personally examined, the discharge assessment and plan has been personally reviewed. 40 minutes of time have been dedicated to discharge assessment, planning and discharge instructions.   Signed:  [name], MD, PhD 08/27/2020, 12:56 PM [facility] [phone] or if no answer before 7:00PM call [phone] For any issues after 7:00PM please call eLink [phone]

## 2020-08-28 DIAGNOSIS — F1011 Alcohol abuse, in remission: Secondary | ICD-10-CM

## 2020-08-28 DIAGNOSIS — A419 Sepsis, unspecified organism: Secondary | ICD-10-CM | POA: Diagnosis not present

## 2020-08-28 DIAGNOSIS — J9621 Acute and chronic respiratory failure with hypoxia: Secondary | ICD-10-CM

## 2020-08-28 DIAGNOSIS — J151 Pneumonia due to Pseudomonas: Secondary | ICD-10-CM

## 2020-08-28 DIAGNOSIS — R6520 Severe sepsis without septic shock: Secondary | ICD-10-CM

## 2020-08-28 LAB — URINALYSIS, ROUTINE W REFLEX MICROSCOPIC
Bacteria, UA: NONE SEEN
Bilirubin Urine: NEGATIVE
Glucose, UA: NEGATIVE mg/dL
Hgb urine dipstick: NEGATIVE
Ketones, ur: NEGATIVE mg/dL
Leukocytes,Ua: NEGATIVE
Nitrite: NEGATIVE
Protein, ur: 100 mg/dL — AB
Specific Gravity, Urine: 1.016 (ref 1.005–1.030)
pH: 9 — ABNORMAL HIGH (ref 5.0–8.0)

## 2020-08-28 LAB — BASIC METABOLIC PANEL
Anion gap: 8 (ref 5–15)
BUN: 39 mg/dL — ABNORMAL HIGH (ref 6–20)
CO2: 32 mmol/L (ref 22–32)
Calcium: 10.5 mg/dL — ABNORMAL HIGH (ref 8.9–10.3)
Chloride: 103 mmol/L (ref 98–111)
Creatinine, Ser: 0.72 mg/dL (ref 0.61–1.24)
GFR, Estimated: >60 mL/min (ref >60)
Glucose, Bld: 166 mg/dL — ABNORMAL HIGH (ref 70–99)
Potassium: 4 mmol/L (ref 3.5–5.1)
Sodium: 143 mmol/L (ref 135–145)

## 2020-08-28 LAB — CBC
HCT: 33.4 % — ABNORMAL LOW (ref 39.0–52.0)
Hemoglobin: 10.1 g/dL — ABNORMAL LOW (ref 13.0–17.0)
MCH: 27.7 pg (ref 26.0–34.0)
MCHC: 30.2 g/dL (ref 30.0–36.0)
MCV: 91.8 fL (ref 80.0–100.0)
Platelets: 404 10*3/uL — ABNORMAL HIGH (ref 150–400)
RBC: 3.64 MIL/uL — ABNORMAL LOW (ref 4.22–5.81)
RDW: 17.5 % — ABNORMAL HIGH (ref 11.5–15.5)
WBC: 9.4 10*3/uL (ref 4.0–10.5)
nRBC: 0 % (ref 0.0–0.2)

## 2020-08-28 NOTE — Progress Notes (Signed)
Pulmonary Critical Care Medicine [facility] [place]   PULMONARY CRITICAL CARE SERVICE  PROGRESS NOTE     Alex Rojas  RUE:[id number]  DOB: [birth year]   DOA: 08/27/2020  Referring Physician: Carron Curie, MD  HPI: Alex Rojas is a 50 y.o. male seen for follow up of Acute on Chronic Respiratory Failure.  Patient transferred back to our facility after having been in the ICU doing somewhat better patient is awake remains on the ventilator  Medications: Reviewed on Rounds  Physical Exam:  Vitals: Temperature is 97.4 pulse 101 respiratory 25 blood pressure is 120/74 saturations 97%  Ventilator Settings on assist control FiO2 50% tidal volume 490 PEEP 5  . General: Comfortable at this time . Eyes: Grossly normal lids, irises & conjunctiva . ENT: grossly tongue is normal . Neck: no obvious mass . Cardiovascular: S1 S2 normal no gallop . Respiratory: No rhonchi very coarse breath sounds . Abdomen: soft . Skin: no rash seen on limited exam . Musculoskeletal: not rigid . Psychiatric:unable to assess . Neurologic: no seizure no involuntary movements         Lab Data:   Basic Metabolic Panel: Recent Labs  Lab 08/24/20 0529 08/25/20 0505 08/26/20 0405 08/27/20 0320 08/28/20 0341  NA 145 146* 145 143 143  K 3.9 3.8 3.7 3.9 4.0  CL 104 103 101 102 103  CO2 35* 35* 35* 33* 32  GLUCOSE 190* 157* 144* 159* 166*  BUN 34* 35* 35* 37* 39*  CREATININE 0.48* 0.55* 0.61 0.69 0.72  CALCIUM 10.4* 10.5* 10.7* 10.7* 10.5*  MG  --   --  2.1 2.3  --     ABG: Recent Labs  Lab 08/23/20 1417  PHART 7.447  PCO2ART 54.2*  PO2ART 70*  HCO3 37.1*  O2SAT 93.0    Liver Function Tests: No results for input(s): AST, ALT, ALKPHOS, BILITOT, PROT, ALBUMIN in the last 168 hours. No results for input(s): LIPASE, AMYLASE in the last 168 hours. No results for input(s): AMMONIA in the last 168 hours.  CBC: Recent Labs  Lab 08/24/20 0529 08/25/20 0505  08/26/20 0405 08/27/20 0320 08/28/20 0341  WBC 7.9 7.3 10.6* 10.5 9.4  HGB 8.8* 9.2* 9.4* 9.8* 10.1*  HCT 29.6* 31.5* 31.8* 32.9* 33.4*  MCV 96.1 95.5 93.5 93.5 91.8  PLT 394 399 414* 419* 404*    Cardiac Enzymes: No results for input(s): CKTOTAL, CKMB, CKMBINDEX, TROPONINI in the last 168 hours.  BNP (last 3 results) No results for input(s): BNP in the last 8760 hours.  ProBNP (last 3 results) No results for input(s): PROBNP in the last 8760 hours.  Radiological Exams: DG ABDOMEN PEG TUBE LOCATION  Result Date: 08/28/2020 CLINICAL DATA:  Peg placement. EXAM: ABDOMEN - 1 VIEW COMPARISON:  None. FINDINGS: Portable AP supine views of the abdomen obtained after the installation of contrast through indwelling gastrostomy tube. Type and amount of contrast not specified. Contrast opacifies the stomach and duodenum. There is no evidence of extravasation or leak. Surgical clips in the right upper quadrant typical of cholecystectomy. Mild gaseous distension of transverse colon. IMPRESSION: Gastrostomy tube in the stomach without evidence of extravasation or leak. Electronically Signed   By: Narda Rutherford M.D.   On: 08/28/2020 00:00    Assessment/Plan Active Problems:   Acute on chronic respiratory failure with hypoxia (HCC)   Severe sepsis (HCC)   History of alcohol abuse   Pneumonia of both lower lobes due to Pseudomonas species (HCC)   1. Acute on  chronic respiratory failure with hypoxia patient is doing well now on the ventilator and assist control mode the plan is going to be to try to start weaning protocol 2. Severe sepsis treated resolved patient has been on antibiotics 3. Pneumonia due to Pseudomonas has been treated with meropenem 4. History of alcohol abuse no sign of active withdrawal   I have personally seen and evaluated the patient, evaluated laboratory and imaging results, formulated the assessment and plan and placed orders. The Patient requires high complexity  decision making with multiple systems involvement.  Rounds were done with the Respiratory Therapy Director and Staff therapists and discussed with nursing staff also.  Yevonne Pax, MD [facility] Pulmonary Critical Care Medicine Sleep Medicine

## 2020-08-29 ENCOUNTER — Other Ambulatory Visit (HOSPITAL_COMMUNITY): Payer: No Typology Code available for payment source

## 2020-08-29 LAB — COMPREHENSIVE METABOLIC PANEL
ALT: 43 U/L (ref 0–44)
AST: 28 U/L (ref 15–41)
Albumin: 3.1 g/dL — ABNORMAL LOW (ref 3.5–5.0)
Alkaline Phosphatase: 100 U/L (ref 38–126)
Anion gap: 9 (ref 5–15)
BUN: 33 mg/dL — ABNORMAL HIGH (ref 6–20)
CO2: 33 mmol/L — ABNORMAL HIGH (ref 22–32)
Calcium: 10.6 mg/dL — ABNORMAL HIGH (ref 8.9–10.3)
Chloride: 100 mmol/L (ref 98–111)
Creatinine, Ser: 0.74 mg/dL (ref 0.61–1.24)
GFR, Estimated: >60 mL/min (ref >60)
Glucose, Bld: 115 mg/dL — ABNORMAL HIGH (ref 70–99)
Potassium: 4.1 mmol/L (ref 3.5–5.1)
Sodium: 142 mmol/L (ref 135–145)
Total Bilirubin: 0.7 mg/dL (ref 0.3–1.2)
Total Protein: 8.3 g/dL — ABNORMAL HIGH (ref 6.5–8.1)

## 2020-08-29 LAB — CBC
HCT: 35.3 % — ABNORMAL LOW (ref 39.0–52.0)
Hemoglobin: 10.7 g/dL — ABNORMAL LOW (ref 13.0–17.0)
MCH: 27.5 pg (ref 26.0–34.0)
MCHC: 30.3 g/dL (ref 30.0–36.0)
MCV: 90.7 fL (ref 80.0–100.0)
Platelets: 447 10*3/uL — ABNORMAL HIGH (ref 150–400)
RBC: 3.89 MIL/uL — ABNORMAL LOW (ref 4.22–5.81)
RDW: 17.5 % — ABNORMAL HIGH (ref 11.5–15.5)
WBC: 9.9 10*3/uL (ref 4.0–10.5)
nRBC: 0 % (ref 0.0–0.2)

## 2020-08-29 LAB — MAGNESIUM: Magnesium: 2.6 mg/dL — ABNORMAL HIGH (ref 1.7–2.4)

## 2020-08-29 LAB — PHOSPHORUS: Phosphorus: 5.5 mg/dL — ABNORMAL HIGH (ref 2.5–4.6)

## 2020-08-30 LAB — CBC
HCT: 35.1 % — ABNORMAL LOW (ref 39.0–52.0)
Hemoglobin: 10.9 g/dL — ABNORMAL LOW (ref 13.0–17.0)
MCH: 27.6 pg (ref 26.0–34.0)
MCHC: 31.1 g/dL (ref 30.0–36.0)
MCV: 88.9 fL (ref 80.0–100.0)
Platelets: 456 10*3/uL — ABNORMAL HIGH (ref 150–400)
RBC: 3.95 MIL/uL — ABNORMAL LOW (ref 4.22–5.81)
RDW: 16.9 % — ABNORMAL HIGH (ref 11.5–15.5)
WBC: 9.8 10*3/uL (ref 4.0–10.5)
nRBC: 0 % (ref 0.0–0.2)

## 2020-08-30 LAB — BASIC METABOLIC PANEL
Anion gap: 14 (ref 5–15)
BUN: 36 mg/dL — ABNORMAL HIGH (ref 6–20)
CO2: 31 mmol/L (ref 22–32)
Calcium: 10.6 mg/dL — ABNORMAL HIGH (ref 8.9–10.3)
Chloride: 94 mmol/L — ABNORMAL LOW (ref 98–111)
Creatinine, Ser: 0.79 mg/dL (ref 0.61–1.24)
GFR, Estimated: >60 mL/min (ref >60)
Glucose, Bld: 202 mg/dL — ABNORMAL HIGH (ref 70–99)
Potassium: 3.7 mmol/L (ref 3.5–5.1)
Sodium: 139 mmol/L (ref 135–145)

## 2020-08-30 LAB — URINE CULTURE: Culture: NO GROWTH

## 2020-08-30 LAB — PHOSPHORUS: Phosphorus: 6.5 mg/dL — ABNORMAL HIGH (ref 2.5–4.6)

## 2020-08-30 LAB — MAGNESIUM: Magnesium: 2.3 mg/dL (ref 1.7–2.4)

## 2020-08-31 DIAGNOSIS — J9621 Acute and chronic respiratory failure with hypoxia: Secondary | ICD-10-CM | POA: Diagnosis not present

## 2020-08-31 DIAGNOSIS — J151 Pneumonia due to Pseudomonas: Secondary | ICD-10-CM | POA: Diagnosis not present

## 2020-08-31 DIAGNOSIS — A419 Sepsis, unspecified organism: Secondary | ICD-10-CM | POA: Diagnosis not present

## 2020-08-31 DIAGNOSIS — F1011 Alcohol abuse, in remission: Secondary | ICD-10-CM | POA: Diagnosis not present

## 2020-08-31 LAB — CULTURE, RESPIRATORY W GRAM STAIN

## 2020-08-31 NOTE — Progress Notes (Signed)
Pulmonary Critical Care Medicine [facility] [place]   PULMONARY CRITICAL CARE SERVICE  PROGRESS NOTE     Alex Rojas  HRC:[id number]  DOB: 1970-08-20   DOA: 08/27/2020  Referring Physician: Carron Curie, MD  HPI: Alex Rojas is a 50 y.o. male seen for follow up of Acute on Chronic Respiratory Failure.  Patient right now is on pressure support has been on a pressure of 12/5  Medications: Reviewed on Rounds  Physical Exam:  Vitals: Temperature is 97.8 pulse 101 respiratory 20 blood pressure is 129/80 saturations 100%  Ventilator Settings on pressure support pressure of 12/5  . General: Comfortable at this time . Eyes: Grossly normal lids, irises & conjunctiva . ENT: grossly tongue is normal . Neck: no obvious mass . Cardiovascular: S1 S2 normal no gallop . Respiratory: No rhonchi very coarse breath sound . Abdomen: soft . Skin: no rash seen on limited exam . Musculoskeletal: not rigid . Psychiatric:unable to assess . Neurologic: no seizure no involuntary movements         Lab Data:   Basic Metabolic Panel: Recent Labs  Lab 08/26/20 0405 08/27/20 0320 08/28/20 0341 08/29/20 0316 08/30/20 0203  NA 145 143 143 142 139  K 3.7 3.9 4.0 4.1 3.7  CL 101 102 103 100 94*  CO2 35* 33* 32 33* 31  GLUCOSE 144* 159* 166* 115* 202*  BUN 35* 37* 39* 33* 36*  CREATININE 0.61 0.69 0.72 0.74 0.79  CALCIUM 10.7* 10.7* 10.5* 10.6* 10.6*  MG 2.1 2.3  --  2.6* 2.3  PHOS  --   --   --  5.5* 6.5*    ABG: No results for input(s): PHART, PCO2ART, PO2ART, HCO3, O2SAT in the last 168 hours.  Liver Function Tests: Recent Labs  Lab 08/29/20 0316  AST 28  ALT 43  ALKPHOS 100  BILITOT 0.7  PROT 8.3*  ALBUMIN 3.1*   No results for input(s): LIPASE, AMYLASE in the last 168 hours. No results for input(s): AMMONIA in the last 168 hours.  CBC: Recent Labs  Lab 08/26/20 0405 08/27/20 0320 08/28/20 0341 08/29/20 0316 08/30/20 0203  WBC 10.6*  10.5 9.4 9.9 9.8  HGB 9.4* 9.8* 10.1* 10.7* 10.9*  HCT 31.8* 32.9* 33.4* 35.3* 35.1*  MCV 93.5 93.5 91.8 90.7 88.9  PLT 414* 419* 404* 447* 456*    Cardiac Enzymes: No results for input(s): CKTOTAL, CKMB, CKMBINDEX, TROPONINI in the last 168 hours.  BNP (last 3 results) No results for input(s): BNP in the last 8760 hours.  ProBNP (last 3 results) No results for input(s): PROBNP in the last 8760 hours.  Radiological Exams: No results found.  Assessment/Plan Active Problems:   Acute on chronic respiratory failure with hypoxia (HCC)   Severe sepsis (HCC)   History of alcohol abuse   Pneumonia of both lower lobes due to Pseudomonas species (HCC)   1. Acute on chronic respiratory failure with hypoxia we will continue with pressure support 12/5.  The goal will be 12 hours 2. Severe sepsis treated we will continue with supportive care 3. History of alcohol abuse no change 4. Pneumonia due to lower lobes patient has been on treatment for Pseudomonas   I have personally seen and evaluated the patient, evaluated laboratory and imaging results, formulated the assessment and plan and placed orders. The Patient requires high complexity decision making with multiple systems involvement.  Rounds were done with the Respiratory Therapy Director and Staff therapists and discussed with nursing staff also.  Patrik Turnbaugh  Richardson Dopp, MD [facility] Pulmonary Critical Care Medicine Sleep Medicine

## 2020-09-01 DIAGNOSIS — F1011 Alcohol abuse, in remission: Secondary | ICD-10-CM | POA: Diagnosis not present

## 2020-09-01 DIAGNOSIS — J9621 Acute and chronic respiratory failure with hypoxia: Secondary | ICD-10-CM | POA: Diagnosis not present

## 2020-09-01 DIAGNOSIS — J151 Pneumonia due to Pseudomonas: Secondary | ICD-10-CM | POA: Diagnosis not present

## 2020-09-01 DIAGNOSIS — A419 Sepsis, unspecified organism: Secondary | ICD-10-CM | POA: Diagnosis not present

## 2020-09-01 NOTE — Progress Notes (Signed)
Pulmonary Critical Care Medicine [facility] [place]   PULMONARY CRITICAL CARE SERVICE  PROGRESS NOTE     Alex Rojas  IWL:[id number]  DOB: 13-May-1970   DOA: 08/27/2020  Referring Physician: Carron Curie, MD  HPI: Alex Rojas is a 50 y.o. male seen for follow up of Acute on Chronic Respiratory Failure.  Patient currently is on pressure support mode has been on 35% FiO2 goal of 16 hours  Medications: Reviewed on Rounds  Physical Exam:  Vitals: Temperature is 97.7 pulse 102 respiratory rate 20 blood pressure is 124/78 saturations 98  Ventilator Settings patient is currently on pressure support FiO2 35% pressure 12/5  . General: Comfortable at this time . Eyes: Grossly normal lids, irises & conjunctiva . ENT: grossly tongue is normal . Neck: no obvious mass . Cardiovascular: S1 S2 normal no gallop . Respiratory: No rhonchi no rales noted at this time. . Abdomen: soft . Skin: no rash seen on limited exam . Musculoskeletal: not rigid . Psychiatric:unable to assess . Neurologic: no seizure no involuntary movements         Lab Data:   Basic Metabolic Panel: Recent Labs  Lab 08/26/20 0405 08/27/20 0320 08/28/20 0341 08/29/20 0316 08/30/20 0203  NA 145 143 143 142 139  K 3.7 3.9 4.0 4.1 3.7  CL 101 102 103 100 94*  CO2 35* 33* 32 33* 31  GLUCOSE 144* 159* 166* 115* 202*  BUN 35* 37* 39* 33* 36*  CREATININE 0.61 0.69 0.72 0.74 0.79  CALCIUM 10.7* 10.7* 10.5* 10.6* 10.6*  MG 2.1 2.3  --  2.6* 2.3  PHOS  --   --   --  5.5* 6.5*    ABG: No results for input(s): PHART, PCO2ART, PO2ART, HCO3, O2SAT in the last 168 hours.  Liver Function Tests: Recent Labs  Lab 08/29/20 0316  AST 28  ALT 43  ALKPHOS 100  BILITOT 0.7  PROT 8.3*  ALBUMIN 3.1*   No results for input(s): LIPASE, AMYLASE in the last 168 hours. No results for input(s): AMMONIA in the last 168 hours.  CBC: Recent Labs  Lab 08/26/20 0405 08/27/20 0320  08/28/20 0341 08/29/20 0316 08/30/20 0203  WBC 10.6* 10.5 9.4 9.9 9.8  HGB 9.4* 9.8* 10.1* 10.7* 10.9*  HCT 31.8* 32.9* 33.4* 35.3* 35.1*  MCV 93.5 93.5 91.8 90.7 88.9  PLT 414* 419* 404* 447* 456*    Cardiac Enzymes: No results for input(s): CKTOTAL, CKMB, CKMBINDEX, TROPONINI in the last 168 hours.  BNP (last 3 results) No results for input(s): BNP in the last 8760 hours.  ProBNP (last 3 results) No results for input(s): PROBNP in the last 8760 hours.  Radiological Exams: No results found.  Assessment/Plan Active Problems:   Acute on chronic respiratory failure with hypoxia (HCC)   Severe sepsis (HCC)   History of alcohol abuse   Pneumonia of both lower lobes due to Pseudomonas species (HCC)   1. Acute on chronic respiratory failure with hypoxia continue with the wean protocol patient should be doing 16-hour goal today 2. Severe sepsis treated resolved 3. History of alcohol abuse no change 4. Pneumonia lower lobe secondary to Pseudomonas treated slowly improving   I have personally seen and evaluated the patient, evaluated laboratory and imaging results, formulated the assessment and plan and placed orders. The Patient requires high complexity decision making with multiple systems involvement.  Rounds were done with the Respiratory Therapy Director and Staff therapists and discussed with nursing staff also.  Gentle Hoge A  Humphrey Rolls, MD [facility] Pulmonary Critical Care Medicine Sleep Medicine

## 2020-09-02 DIAGNOSIS — J9621 Acute and chronic respiratory failure with hypoxia: Secondary | ICD-10-CM | POA: Diagnosis not present

## 2020-09-02 DIAGNOSIS — J151 Pneumonia due to Pseudomonas: Secondary | ICD-10-CM | POA: Diagnosis not present

## 2020-09-02 DIAGNOSIS — A419 Sepsis, unspecified organism: Secondary | ICD-10-CM | POA: Diagnosis not present

## 2020-09-02 DIAGNOSIS — F1011 Alcohol abuse, in remission: Secondary | ICD-10-CM | POA: Diagnosis not present

## 2020-09-02 NOTE — Progress Notes (Signed)
Pulmonary Critical Care Medicine [facility] [place]   PULMONARY CRITICAL CARE SERVICE  PROGRESS NOTE     Alex Rojas  WCB:[id number]  DOB: [birth year]   DOA: 08/27/2020  Referring Physician: Carron Curie, MD  HPI: Alex Rojas is a 50 y.o. male seen for follow up of Acute on Chronic Respiratory Failure.  Patient continues to wean on T collar supposed to do about 2 hours of weaning today  Medications: Reviewed on Rounds  Physical Exam:  Vitals: Temperature is 97.5 pulse 97 respiratory 22 blood pressure is 115/70 saturations 96%  Ventilator Settings on T collar with an FiO2 of 30%  . General: Comfortable at this time . Eyes: Grossly normal lids, irises & conjunctiva . ENT: grossly tongue is normal . Neck: no obvious mass . Cardiovascular: S1 S2 normal no gallop . Respiratory: No rhonchi no rales noted at this time . Abdomen: soft . Skin: no rash seen on limited exam . Musculoskeletal: not rigid . Psychiatric:unable to assess . Neurologic: no seizure no involuntary movements         Lab Data:   Basic Metabolic Panel: Recent Labs  Lab 08/27/20 0320 08/28/20 0341 08/29/20 0316 08/30/20 0203  NA 143 143 142 139  K 3.9 4.0 4.1 3.7  CL 102 103 100 94*  CO2 33* 32 33* 31  GLUCOSE 159* 166* 115* 202*  BUN 37* 39* 33* 36*  CREATININE 0.69 0.72 0.74 0.79  CALCIUM 10.7* 10.5* 10.6* 10.6*  MG 2.3  --  2.6* 2.3  PHOS  --   --  5.5* 6.5*    ABG: No results for input(s): PHART, PCO2ART, PO2ART, HCO3, O2SAT in the last 168 hours.  Liver Function Tests: Recent Labs  Lab 08/29/20 0316  AST 28  ALT 43  ALKPHOS 100  BILITOT 0.7  PROT 8.3*  ALBUMIN 3.1*   No results for input(s): LIPASE, AMYLASE in the last 168 hours. No results for input(s): AMMONIA in the last 168 hours.  CBC: Recent Labs  Lab 08/27/20 0320 08/28/20 0341 08/29/20 0316 08/30/20 0203  WBC 10.5 9.4 9.9 9.8  HGB 9.8* 10.1* 10.7* 10.9*  HCT 32.9* 33.4* 35.3*  35.1*  MCV 93.5 91.8 90.7 88.9  PLT 419* 404* 447* 456*    Cardiac Enzymes: No results for input(s): CKTOTAL, CKMB, CKMBINDEX, TROPONINI in the last 168 hours.  BNP (last 3 results) No results for input(s): BNP in the last 8760 hours.  ProBNP (last 3 results) No results for input(s): PROBNP in the last 8760 hours.  Radiological Exams: No results found.  Assessment/Plan Active Problems:   Acute on chronic respiratory failure with hypoxia (HCC)   Severe sepsis (HCC)   History of alcohol abuse   Pneumonia of both lower lobes due to Pseudomonas species (HCC)   1. Acute on chronic respiratory failure hypoxia we will continue with T collar trials patient currently is on a goal of 2 hours. 2. Severe sepsis treated in resolution phase we will continue to monitor 3. Pneumonia due to Pseudomonas has been treated 4. Alcohol abuse no change continue to monitor   I have personally seen and evaluated the patient, evaluated laboratory and imaging results, formulated the assessment and plan and placed orders. The Patient requires high complexity decision making with multiple systems involvement.  Rounds were done with the Respiratory Therapy Director and Staff therapists and discussed with nursing staff also.  Yevonne Pax, MD [facility] Pulmonary Critical Care Medicine Sleep Medicine

## 2020-09-03 DIAGNOSIS — J151 Pneumonia due to Pseudomonas: Secondary | ICD-10-CM | POA: Diagnosis not present

## 2020-09-03 DIAGNOSIS — J9621 Acute and chronic respiratory failure with hypoxia: Secondary | ICD-10-CM | POA: Diagnosis not present

## 2020-09-03 DIAGNOSIS — F1011 Alcohol abuse, in remission: Secondary | ICD-10-CM | POA: Diagnosis not present

## 2020-09-03 DIAGNOSIS — A419 Sepsis, unspecified organism: Secondary | ICD-10-CM | POA: Diagnosis not present

## 2020-09-03 NOTE — Progress Notes (Signed)
Pulmonary Critical Care Medicine [facility] [place]   PULMONARY CRITICAL CARE SERVICE  PROGRESS NOTE     Alex Rojas  [id number]  DOB: [birth year]   DOA: 08/27/2020  Referring Physician: Carron Curie, MD  HPI: Alex Rojas is a 50 y.o. male seen for follow up of Acute on Chronic Respiratory Failure.  Patient is currently on T collar has been on 30% FiO2  Medications: Reviewed on Rounds  Physical Exam:  Vitals: Temperature is 98 pulse 99 respiratory rate 20 blood pressure is 117/69 saturations 94%  Ventilator Settings on T collar FiO2 30% goal of 4 hours  . General: Comfortable at this time . Eyes: Grossly normal lids, irises & conjunctiva . ENT: grossly tongue is normal . Neck: no obvious mass . Cardiovascular: S1 S2 normal no gallop . Respiratory: Scattered rhonchi expansion is equal . Abdomen: soft . Skin: no rash seen on limited exam . Musculoskeletal: not rigid . Psychiatric:unable to assess . Neurologic: no seizure no involuntary movements         Lab Data:   Basic Metabolic Panel: Recent Labs  Lab 08/28/20 0341 08/29/20 0316 08/30/20 0203  NA 143 142 139  K 4.0 4.1 3.7  CL 103 100 94*  CO2 32 33* 31  GLUCOSE 166* 115* 202*  BUN 39* 33* 36*  CREATININE 0.72 0.74 0.79  CALCIUM 10.5* 10.6* 10.6*  MG  --  2.6* 2.3  PHOS  --  5.5* 6.5*    ABG: No results for input(s): PHART, PCO2ART, PO2ART, HCO3, O2SAT in the last 168 hours.  Liver Function Tests: Recent Labs  Lab 08/29/20 0316  AST 28  ALT 43  ALKPHOS 100  BILITOT 0.7  PROT 8.3*  ALBUMIN 3.1*   No results for input(s): LIPASE, AMYLASE in the last 168 hours. No results for input(s): AMMONIA in the last 168 hours.  CBC: Recent Labs  Lab 08/28/20 0341 08/29/20 0316 08/30/20 0203  WBC 9.4 9.9 9.8  HGB 10.1* 10.7* 10.9*  HCT 33.4* 35.3* 35.1*  MCV 91.8 90.7 88.9  PLT 404* 447* 456*    Cardiac Enzymes: No results for input(s): CKTOTAL, CKMB,  CKMBINDEX, TROPONINI in the last 168 hours.  BNP (last 3 results) No results for input(s): BNP in the last 8760 hours.  ProBNP (last 3 results) No results for input(s): PROBNP in the last 8760 hours.  Radiological Exams: No results found.  Assessment/Plan Active Problems:   Acute on chronic respiratory failure with hypoxia (HCC)   Severe sepsis (HCC)   History of alcohol abuse   Pneumonia of both lower lobes due to Pseudomonas species (HCC)   1. Acute on chronic respiratory failure with hypoxia plan is going to be to continue with the T collar weaning for our goal 2. Severe sepsis treated in resolution 3. Alcohol abuse no change 4. Pneumonia due to Pseudomonas treated slowly improving   I have personally seen and evaluated the patient, evaluated laboratory and imaging results, formulated the assessment and plan and placed orders. The Patient requires high complexity decision making with multiple systems involvement.  Rounds were done with the Respiratory Therapy Director and Staff therapists and discussed with nursing staff also.  Yevonne Pax, MD [facility] Pulmonary Critical Care Medicine Sleep Medicine

## 2020-09-04 DIAGNOSIS — A419 Sepsis, unspecified organism: Secondary | ICD-10-CM | POA: Diagnosis not present

## 2020-09-04 DIAGNOSIS — F1011 Alcohol abuse, in remission: Secondary | ICD-10-CM | POA: Diagnosis not present

## 2020-09-04 DIAGNOSIS — J151 Pneumonia due to Pseudomonas: Secondary | ICD-10-CM | POA: Diagnosis not present

## 2020-09-04 DIAGNOSIS — J9621 Acute and chronic respiratory failure with hypoxia: Secondary | ICD-10-CM | POA: Diagnosis not present

## 2020-09-04 NOTE — Progress Notes (Signed)
Pulmonary Critical Care Medicine [facility] [place]   PULMONARY CRITICAL CARE SERVICE  PROGRESS NOTE     Bartolo Montanye  BWL:[id number]  DOB: 12-03-1970   DOA: 08/27/2020  Referring Physician: Carron Curie, MD  HPI: Alex Rojas is a 50 y.o. male seen for follow up of Acute on Chronic Respiratory Failure.  Patient currently is on T collar has been on 30% FiO2  Medications: Reviewed on Rounds  Physical Exam:  Vitals: Temperature is 98.0 pulse 91 respiratory rate 20 blood pressure is 118/60 saturations 97%  Ventilator Settings on T collar FiO2 30 percent   . General: Comfortable at this time . Eyes: Grossly normal lids, irises & conjunctiva . ENT: grossly tongue is normal . Neck: no obvious mass . Cardiovascular: S1 S2 normal no gallop . Respiratory: Scattered rhonchi expansion is equal . Abdomen: soft . Skin: no rash seen on limited exam . Musculoskeletal: not rigid . Psychiatric:unable to assess . Neurologic: no seizure no involuntary movements         Lab Data:   Basic Metabolic Panel: Recent Labs  Lab 08/29/20 0316 08/30/20 0203  NA 142 139  K 4.1 3.7  CL 100 94*  CO2 33* 31  GLUCOSE 115* 202*  BUN 33* 36*  CREATININE 0.74 0.79  CALCIUM 10.6* 10.6*  MG 2.6* 2.3  PHOS 5.5* 6.5*    ABG: No results for input(s): PHART, PCO2ART, PO2ART, HCO3, O2SAT in the last 168 hours.  Liver Function Tests: Recent Labs  Lab 08/29/20 0316  AST 28  ALT 43  ALKPHOS 100  BILITOT 0.7  PROT 8.3*  ALBUMIN 3.1*   No results for input(s): LIPASE, AMYLASE in the last 168 hours. No results for input(s): AMMONIA in the last 168 hours.  CBC: Recent Labs  Lab 08/29/20 0316 08/30/20 0203  WBC 9.9 9.8  HGB 10.7* 10.9*  HCT 35.3* 35.1*  MCV 90.7 88.9  PLT 447* 456*    Cardiac Enzymes: No results for input(s): CKTOTAL, CKMB, CKMBINDEX, TROPONINI in the last 168 hours.  BNP (last 3 results) No results for input(s): BNP in the last  8760 hours.  ProBNP (last 3 results) No results for input(s): PROBNP in the last 8760 hours.  Radiological Exams: No results found.  Assessment/Plan Active Problems:   Acute on chronic respiratory failure with hypoxia (HCC)   Severe sepsis (HCC)   History of alcohol abuse   Pneumonia of both lower lobes due to Pseudomonas species (HCC)   1. Acute on chronic respiratory failure with hypoxia plan is to wean on T collar patient goal will be 8 hours.  We will continue to advance as tolerated. 2. Severe sepsis treated in resolution 3. History of alcohol abuse no change we will continue to follow 4. Pneumonia due to Pseudomonas at baseline   I have personally seen and evaluated the patient, evaluated laboratory and imaging results, formulated the assessment and plan and placed orders. The Patient requires high complexity decision making with multiple systems involvement.  Rounds were done with the Respiratory Therapy Director and Staff therapists and discussed with nursing staff also.  Yevonne Pax, MD [facility] Pulmonary Critical Care Medicine Sleep Medicine

## 2020-09-05 LAB — BASIC METABOLIC PANEL
Anion gap: 14 (ref 5–15)
BUN: 32 mg/dL — ABNORMAL HIGH (ref 6–20)
CO2: 31 mmol/L (ref 22–32)
Calcium: 10.5 mg/dL — ABNORMAL HIGH (ref 8.9–10.3)
Chloride: 84 mmol/L — ABNORMAL LOW (ref 98–111)
Creatinine, Ser: 0.76 mg/dL (ref 0.61–1.24)
GFR, Estimated: >60 mL/min (ref >60)
Glucose, Bld: 198 mg/dL — ABNORMAL HIGH (ref 70–99)
Potassium: 3.2 mmol/L — ABNORMAL LOW (ref 3.5–5.1)
Sodium: 129 mmol/L — ABNORMAL LOW (ref 135–145)

## 2020-09-05 LAB — CBC
HCT: 31.9 % — ABNORMAL LOW (ref 39.0–52.0)
Hemoglobin: 10.6 g/dL — ABNORMAL LOW (ref 13.0–17.0)
MCH: 27.5 pg (ref 26.0–34.0)
MCHC: 33.2 g/dL (ref 30.0–36.0)
MCV: 82.6 fL (ref 80.0–100.0)
Platelets: 384 10*3/uL (ref 150–400)
RBC: 3.86 MIL/uL — ABNORMAL LOW (ref 4.22–5.81)
RDW: 16.1 % — ABNORMAL HIGH (ref 11.5–15.5)
WBC: 10.4 10*3/uL (ref 4.0–10.5)
nRBC: 0 % (ref 0.0–0.2)

## 2020-09-06 ENCOUNTER — Other Ambulatory Visit (HOSPITAL_COMMUNITY): Payer: No Typology Code available for payment source

## 2020-09-06 LAB — RENAL FUNCTION PANEL
Albumin: 3.2 g/dL — ABNORMAL LOW (ref 3.5–5.0)
Anion gap: 14 (ref 5–15)
BUN: 25 mg/dL — ABNORMAL HIGH (ref 6–20)
CO2: 32 mmol/L (ref 22–32)
Calcium: 10.8 mg/dL — ABNORMAL HIGH (ref 8.9–10.3)
Chloride: 83 mmol/L — ABNORMAL LOW (ref 98–111)
Creatinine, Ser: 0.75 mg/dL (ref 0.61–1.24)
GFR, Estimated: >60 mL/min (ref >60)
Glucose, Bld: 173 mg/dL — ABNORMAL HIGH (ref 70–99)
Phosphorus: 4.6 mg/dL (ref 2.5–4.6)
Potassium: 3.4 mmol/L — ABNORMAL LOW (ref 3.5–5.1)
Sodium: 129 mmol/L — ABNORMAL LOW (ref 135–145)

## 2020-09-06 LAB — CBC
HCT: 31.7 % — ABNORMAL LOW (ref 39.0–52.0)
Hemoglobin: 10.6 g/dL — ABNORMAL LOW (ref 13.0–17.0)
MCH: 28 pg (ref 26.0–34.0)
MCHC: 33.4 g/dL (ref 30.0–36.0)
MCV: 83.6 fL (ref 80.0–100.0)
Platelets: 343 10*3/uL (ref 150–400)
RBC: 3.79 MIL/uL — ABNORMAL LOW (ref 4.22–5.81)
RDW: 15.9 % — ABNORMAL HIGH (ref 11.5–15.5)
WBC: 9.9 10*3/uL (ref 4.0–10.5)
nRBC: 0 % (ref 0.0–0.2)

## 2020-09-06 LAB — MAGNESIUM: Magnesium: 1.9 mg/dL (ref 1.7–2.4)

## 2020-09-07 DIAGNOSIS — J151 Pneumonia due to Pseudomonas: Secondary | ICD-10-CM | POA: Diagnosis not present

## 2020-09-07 DIAGNOSIS — F1011 Alcohol abuse, in remission: Secondary | ICD-10-CM | POA: Diagnosis not present

## 2020-09-07 DIAGNOSIS — A419 Sepsis, unspecified organism: Secondary | ICD-10-CM | POA: Diagnosis not present

## 2020-09-07 DIAGNOSIS — J9621 Acute and chronic respiratory failure with hypoxia: Secondary | ICD-10-CM | POA: Diagnosis not present

## 2020-09-07 NOTE — Progress Notes (Signed)
Pulmonary Critical Care Medicine [facility] [place]   PULMONARY CRITICAL CARE SERVICE  PROGRESS NOTE     Alex Rojas  [id number]:[id number]  DOB: 10/01/1970   DOA: 08/27/2020  Referring Physician: Carron Curie, MD  HPI: Alex Rojas is a 50 y.o. male seen for follow up of Acute on Chronic Respiratory Failure.  Patient is off the ventilator right now on T collar supposed to do 24 hours weaning  Medications: Reviewed on Rounds  Physical Exam:  Vitals: Temperature is 97.0 pulse 90 respiratory 20 blood pressure is 121/72 saturations 97%  Ventilator Settings on T collar  . General: Comfortable at this time . Eyes: Grossly normal lids, irises & conjunctiva . ENT: grossly tongue is normal . Neck: no obvious mass . Cardiovascular: S1 S2 normal no gallop . Respiratory: No rhonchi no rales are noted . Abdomen: soft . Skin: no rash seen on limited exam . Musculoskeletal: not rigid . Psychiatric:unable to assess . Neurologic: no seizure no involuntary movements         Lab Data:   Basic Metabolic Panel: Recent Labs  Lab 09/05/20 0522 09/06/20 0402  NA 129* 129*  K 3.2* 3.4*  CL 84* 83*  CO2 31 32  GLUCOSE 198* 173*  BUN 32* 25*  CREATININE 0.76 0.75  CALCIUM 10.5* 10.8*  MG  --  1.9  PHOS  --  4.6    ABG: No results for input(s): PHART, PCO2ART, PO2ART, HCO3, O2SAT in the last 168 hours.  Liver Function Tests: Recent Labs  Lab 09/06/20 0402  ALBUMIN 3.2*   No results for input(s): LIPASE, AMYLASE in the last 168 hours. No results for input(s): AMMONIA in the last 168 hours.  CBC: Recent Labs  Lab 09/05/20 0522 09/06/20 0402  WBC 10.4 9.9  HGB 10.6* 10.6*  HCT 31.9* 31.7*  MCV 82.6 83.6  PLT 384 343    Cardiac Enzymes: No results for input(s): CKTOTAL, CKMB, CKMBINDEX, TROPONINI in the last 168 hours.  BNP (last 3 results) No results for input(s): BNP in the last 8760 hours.  ProBNP (last 3 results) No results for  input(s): PROBNP in the last 8760 hours.  Radiological Exams: DG Chest Port 1 View  Result Date: 09/06/2020 CLINICAL DATA:  Respiratory failure EXAM: PORTABLE CHEST 1 VIEW COMPARISON:  Aug 29, 2020 FINDINGS: Tracheostomy catheter tip is 7.8 cm above the carina. No edema or airspace opacity. There is slight right base atelectasis. Heart size and pulmonary vascularity are normal. No adenopathy. No bone lesions. IMPRESSION: Tracheostomy as described without pneumothorax. Slight right base atelectasis. No edema or airspace opacity. Heart size normal. Electronically Signed   By: Bretta Bang III M.D.   On: 09/06/2020 07:52    Assessment/Plan Active Problems:   Acute on chronic respiratory failure with hypoxia (HCC)   Severe sepsis (HCC)   History of alcohol abuse   Pneumonia of both lower lobes due to Pseudomonas species (HCC)   1. Acute on chronic respiratory failure with hypoxia plan is going to be to continue with the weaning goal is 24 hours on T collar 2. Severe sepsis treated in resolution 3. History of alcohol use no change we will continue to monitor 4. Pneumonia secondary to Pseudomonas supportive care treated clinically improving   I have personally seen and evaluated the patient, evaluated laboratory and imaging results, formulated the assessment and plan and placed orders. The Patient requires high complexity decision making with multiple systems involvement.  Rounds were done with the Respiratory  Therapy Director and Staff therapists and discussed with nursing staff also.  Allyne Gee, MD [facility] Pulmonary Critical Care Medicine Sleep Medicine

## 2020-09-08 DIAGNOSIS — J151 Pneumonia due to Pseudomonas: Secondary | ICD-10-CM | POA: Diagnosis not present

## 2020-09-08 DIAGNOSIS — F1011 Alcohol abuse, in remission: Secondary | ICD-10-CM | POA: Diagnosis not present

## 2020-09-08 DIAGNOSIS — J9621 Acute and chronic respiratory failure with hypoxia: Secondary | ICD-10-CM | POA: Diagnosis not present

## 2020-09-08 DIAGNOSIS — A419 Sepsis, unspecified organism: Secondary | ICD-10-CM | POA: Diagnosis not present

## 2020-09-08 LAB — BLOOD GAS, ARTERIAL
Acid-Base Excess: 8.3 mmol/L — ABNORMAL HIGH (ref 0.0–2.0)
Bicarbonate: 32.6 mmol/L — ABNORMAL HIGH (ref 20.0–28.0)
FIO2: 30
O2 Saturation: 96.6 %
Patient temperature: 37
pCO2 arterial: 47.4 mmHg (ref 32.0–48.0)
pH, Arterial: 7.452 — ABNORMAL HIGH (ref 7.350–7.450)
pO2, Arterial: 81.7 mmHg — ABNORMAL LOW (ref 83.0–108.0)

## 2020-09-08 LAB — BASIC METABOLIC PANEL
Anion gap: 15 (ref 5–15)
BUN: 25 mg/dL — ABNORMAL HIGH (ref 6–20)
CO2: 33 mmol/L — ABNORMAL HIGH (ref 22–32)
Calcium: 10.7 mg/dL — ABNORMAL HIGH (ref 8.9–10.3)
Chloride: 82 mmol/L — ABNORMAL LOW (ref 98–111)
Creatinine, Ser: 0.71 mg/dL (ref 0.61–1.24)
GFR, Estimated: >60 mL/min (ref >60)
Glucose, Bld: 140 mg/dL — ABNORMAL HIGH (ref 70–99)
Potassium: 3.4 mmol/L — ABNORMAL LOW (ref 3.5–5.1)
Sodium: 130 mmol/L — ABNORMAL LOW (ref 135–145)

## 2020-09-08 LAB — CBC
HCT: 34.5 % — ABNORMAL LOW (ref 39.0–52.0)
Hemoglobin: 11.3 g/dL — ABNORMAL LOW (ref 13.0–17.0)
MCH: 27.4 pg (ref 26.0–34.0)
MCHC: 32.8 g/dL (ref 30.0–36.0)
MCV: 83.5 fL (ref 80.0–100.0)
Platelets: 370 10*3/uL (ref 150–400)
RBC: 4.13 MIL/uL — ABNORMAL LOW (ref 4.22–5.81)
RDW: 16 % — ABNORMAL HIGH (ref 11.5–15.5)
WBC: 13.2 10*3/uL — ABNORMAL HIGH (ref 4.0–10.5)
nRBC: 0 % (ref 0.0–0.2)

## 2020-09-08 LAB — PHOSPHORUS: Phosphorus: 5.6 mg/dL — ABNORMAL HIGH (ref 2.5–4.6)

## 2020-09-08 LAB — MAGNESIUM: Magnesium: 2 mg/dL (ref 1.7–2.4)

## 2020-09-08 NOTE — Progress Notes (Signed)
Pulmonary Critical Care Medicine [facility] [place]   PULMONARY CRITICAL CARE SERVICE  PROGRESS NOTE     Yuriel Lopezmartinez  [id number]:[id number]  DOB: 08-03-1970   DOA: 08/27/2020  Referring Physician: Carron Curie, MD  HPI: Lavelle Akel is a 50 y.o. male seen for follow up of Acute on Chronic Respiratory Failure.  Patient is afebrile right now has been on T collar will be completing 48 hours  Medications: Reviewed on Rounds  Physical Exam:  Vitals: Temperature is 97 pulse 95 respiratory 18 blood pressure is 123/76 saturations 97%  Ventilator Settings off the ventilator on T collar  . General: Comfortable at this time . ENT: grossly unremarkable . Neck: no obvious mass . Cardiovascular: no malignant arrhythmias . Respiratory: No rhonchi no rales are noted at this time . Abdomen: soft . Skin: no rash seen on limited exam . Musculoskeletal: not rigid . Psychiatric:unable to assess . Neurologic: no seizure no involuntary movements         Lab Data:   Basic Metabolic Panel: Recent Labs  Lab 09/05/20 0522 09/06/20 0402 09/08/20 0832  NA 129* 129* 130*  K 3.2* 3.4* 3.4*  CL 84* 83* 82*  CO2 31 32 33*  GLUCOSE 198* 173* 140*  BUN 32* 25* 25*  CREATININE 0.76 0.75 0.71  CALCIUM 10.5* 10.8* 10.7*  MG  --  1.9 2.0  PHOS  --  4.6 5.6*    ABG: Recent Labs  Lab 09/08/20 1010  PHART 7.452*  PCO2ART 47.4  PO2ART 81.7*  HCO3 32.6*  O2SAT 96.6    Liver Function Tests: Recent Labs  Lab 09/06/20 0402  ALBUMIN 3.2*   No results for input(s): LIPASE, AMYLASE in the last 168 hours. No results for input(s): AMMONIA in the last 168 hours.  CBC: Recent Labs  Lab 09/05/20 0522 09/06/20 0402 09/08/20 0832  WBC 10.4 9.9 13.2*  HGB 10.6* 10.6* 11.3*  HCT 31.9* 31.7* 34.5*  MCV 82.6 83.6 83.5  PLT 384 343 370    Cardiac Enzymes: No results for input(s): CKTOTAL, CKMB, CKMBINDEX, TROPONINI in the last 168 hours.  BNP (last 3  results) No results for input(s): BNP in the last 8760 hours.  ProBNP (last 3 results) No results for input(s): PROBNP in the last 8760 hours.  Radiological Exams: No results found.  Assessment/Plan Active Problems:   Acute on chronic respiratory failure with hypoxia (HCC)   Severe sepsis (HCC)   History of alcohol abuse   Pneumonia of both lower lobes due to Pseudomonas species (HCC)   1. Acute on chronic respiratory failure hypoxia plan is going to be to continue with the wean on T collar patient will be going for 48 hours. 2. Severe sepsis treated and resolved 3. History of alcohol abuse no change we will continue to monitor. 4. Pneumonia due to Pseudomonas has been treated clinically is improved   I have personally seen and evaluated the patient, evaluated laboratory and imaging results, formulated the assessment and plan and placed orders. The Patient requires high complexity decision making with multiple systems involvement.  Rounds were done with the Respiratory Therapy Director and Staff therapists and discussed with nursing staff also.  Yevonne Pax, MD [facility] Pulmonary Critical Care Medicine Sleep Medicine

## 2020-09-09 LAB — BASIC METABOLIC PANEL
Anion gap: 13 (ref 5–15)
BUN: 24 mg/dL — ABNORMAL HIGH (ref 6–20)
CO2: 31 mmol/L (ref 22–32)
Calcium: 10.1 mg/dL (ref 8.9–10.3)
Chloride: 84 mmol/L — ABNORMAL LOW (ref 98–111)
Creatinine, Ser: 0.7 mg/dL (ref 0.61–1.24)
GFR, Estimated: >60 mL/min (ref >60)
Glucose, Bld: 167 mg/dL — ABNORMAL HIGH (ref 70–99)
Potassium: 3.4 mmol/L — ABNORMAL LOW (ref 3.5–5.1)
Sodium: 128 mmol/L — ABNORMAL LOW (ref 135–145)

## 2020-09-09 LAB — MAGNESIUM: Magnesium: 1.8 mg/dL (ref 1.7–2.4)

## 2020-09-09 LAB — PHOSPHORUS: Phosphorus: 5.8 mg/dL — ABNORMAL HIGH (ref 2.5–4.6)

## 2020-09-10 ENCOUNTER — Other Ambulatory Visit (HOSPITAL_COMMUNITY): Payer: No Typology Code available for payment source

## 2020-09-10 LAB — BASIC METABOLIC PANEL
Anion gap: 13 (ref 5–15)
BUN: 21 mg/dL — ABNORMAL HIGH (ref 6–20)
CO2: 31 mmol/L (ref 22–32)
Calcium: 10.4 mg/dL — ABNORMAL HIGH (ref 8.9–10.3)
Chloride: 87 mmol/L — ABNORMAL LOW (ref 98–111)
Creatinine, Ser: 0.74 mg/dL (ref 0.61–1.24)
GFR, Estimated: >60 mL/min (ref >60)
Glucose, Bld: 156 mg/dL — ABNORMAL HIGH (ref 70–99)
Potassium: 3.5 mmol/L (ref 3.5–5.1)
Sodium: 131 mmol/L — ABNORMAL LOW (ref 135–145)

## 2020-09-10 LAB — CBC
HCT: 34.3 % — ABNORMAL LOW (ref 39.0–52.0)
Hemoglobin: 11 g/dL — ABNORMAL LOW (ref 13.0–17.0)
MCH: 27.6 pg (ref 26.0–34.0)
MCHC: 32.1 g/dL (ref 30.0–36.0)
MCV: 86.2 fL (ref 80.0–100.0)
Platelets: 347 10*3/uL (ref 150–400)
RBC: 3.98 MIL/uL — ABNORMAL LOW (ref 4.22–5.81)
RDW: 16.5 % — ABNORMAL HIGH (ref 11.5–15.5)
WBC: 12.4 10*3/uL — ABNORMAL HIGH (ref 4.0–10.5)
nRBC: 0 % (ref 0.0–0.2)

## 2020-09-10 LAB — PHOSPHORUS: Phosphorus: 5.6 mg/dL — ABNORMAL HIGH (ref 2.5–4.6)

## 2020-09-10 LAB — MAGNESIUM: Magnesium: 1.9 mg/dL (ref 1.7–2.4)

## 2020-09-11 LAB — PHOSPHORUS: Phosphorus: 6 mg/dL — ABNORMAL HIGH (ref 2.5–4.6)

## 2020-09-11 LAB — BASIC METABOLIC PANEL
Anion gap: 11 (ref 5–15)
BUN: 20 mg/dL (ref 6–20)
CO2: 33 mmol/L — ABNORMAL HIGH (ref 22–32)
Calcium: 10.6 mg/dL — ABNORMAL HIGH (ref 8.9–10.3)
Chloride: 88 mmol/L — ABNORMAL LOW (ref 98–111)
Creatinine, Ser: 0.66 mg/dL (ref 0.61–1.24)
GFR, Estimated: >60 mL/min (ref >60)
Glucose, Bld: 153 mg/dL — ABNORMAL HIGH (ref 70–99)
Potassium: 3.6 mmol/L (ref 3.5–5.1)
Sodium: 132 mmol/L — ABNORMAL LOW (ref 135–145)

## 2020-09-11 LAB — MAGNESIUM: Magnesium: 2 mg/dL (ref 1.7–2.4)

## 2020-09-12 DIAGNOSIS — H6123 Impacted cerumen, bilateral: Secondary | ICD-10-CM

## 2020-09-12 LAB — CBC
HCT: 33.2 % — ABNORMAL LOW (ref 39.0–52.0)
Hemoglobin: 10.6 g/dL — ABNORMAL LOW (ref 13.0–17.0)
MCH: 27.5 pg (ref 26.0–34.0)
MCHC: 31.9 g/dL (ref 30.0–36.0)
MCV: 86.2 fL (ref 80.0–100.0)
Platelets: 363 10*3/uL (ref 150–400)
RBC: 3.85 MIL/uL — ABNORMAL LOW (ref 4.22–5.81)
RDW: 16.7 % — ABNORMAL HIGH (ref 11.5–15.5)
WBC: 11 10*3/uL — ABNORMAL HIGH (ref 4.0–10.5)
nRBC: 0 % (ref 0.0–0.2)

## 2020-09-12 LAB — BASIC METABOLIC PANEL
Anion gap: 12 (ref 5–15)
BUN: 19 mg/dL (ref 6–20)
CO2: 31 mmol/L (ref 22–32)
Calcium: 11 mg/dL — ABNORMAL HIGH (ref 8.9–10.3)
Chloride: 90 mmol/L — ABNORMAL LOW (ref 98–111)
Creatinine, Ser: 0.67 mg/dL (ref 0.61–1.24)
GFR, Estimated: >60 mL/min (ref >60)
Glucose, Bld: 145 mg/dL — ABNORMAL HIGH (ref 70–99)
Potassium: 3.7 mmol/L (ref 3.5–5.1)
Sodium: 133 mmol/L — ABNORMAL LOW (ref 135–145)

## 2020-09-12 LAB — PHOSPHORUS: Phosphorus: 5.8 mg/dL — ABNORMAL HIGH (ref 2.5–4.6)

## 2020-09-12 LAB — MAGNESIUM: Magnesium: 1.9 mg/dL (ref 1.7–2.4)

## 2020-09-13 ENCOUNTER — Other Ambulatory Visit (HOSPITAL_COMMUNITY): Payer: No Typology Code available for payment source

## 2020-09-13 MED ORDER — DIATRIZOATE MEGLUMINE & SODIUM 66-10 % PO SOLN
ORAL | Status: AC
  Filled 2020-09-13: qty 30

## 2020-09-14 ENCOUNTER — Other Ambulatory Visit (HOSPITAL_COMMUNITY): Payer: No Typology Code available for payment source

## 2020-09-14 NOTE — Progress Notes (Signed)
  Request seen for replacement of gastrostomy tube.  CT showed= Evidence of patient's percutaneous gastrostomy tube. Tube position has changed slightly compared to the previous exams. The hub of the gastrostomy tube no longer abuts the gastric wall as this has been pulled back slightly and is located within the left rectus abdominus muscle approximately 9 mm superficial to the gastric wall.  Gtube was initially placed at [facility] on 07/09/20.  Images reviewed by Dr. Loreta Ave.  Via telephone, risks and benefits image guided gastrostomy tube replacement was discussed with the patient's step-mother Nelva Nay, including, but not limited to bleeding, infection, peritonitis and/or damage to adjacent structures.  All questions were answered and she is agreeable to proceed.  Consent obtained.  Will proceed with exchange tomorrow, September 15, 2020.  Ritu Gagliardo S Maxmillian Carsey PA-C 09/14/2020 11:39 AM

## 2020-09-15 ENCOUNTER — Other Ambulatory Visit (HOSPITAL_COMMUNITY): Payer: No Typology Code available for payment source

## 2020-09-15 DIAGNOSIS — J9621 Acute and chronic respiratory failure with hypoxia: Secondary | ICD-10-CM | POA: Diagnosis not present

## 2020-09-15 DIAGNOSIS — J151 Pneumonia due to Pseudomonas: Secondary | ICD-10-CM | POA: Diagnosis not present

## 2020-09-15 DIAGNOSIS — F1011 Alcohol abuse, in remission: Secondary | ICD-10-CM | POA: Diagnosis not present

## 2020-09-15 DIAGNOSIS — A419 Sepsis, unspecified organism: Secondary | ICD-10-CM | POA: Diagnosis not present

## 2020-09-15 LAB — BASIC METABOLIC PANEL
Anion gap: 11 (ref 5–15)
BUN: 13 mg/dL (ref 6–20)
CO2: 27 mmol/L (ref 22–32)
Calcium: 10.3 mg/dL (ref 8.9–10.3)
Chloride: 98 mmol/L (ref 98–111)
Creatinine, Ser: 0.62 mg/dL (ref 0.61–1.24)
GFR, Estimated: >60 mL/min (ref >60)
Glucose, Bld: 102 mg/dL — ABNORMAL HIGH (ref 70–99)
Potassium: 3.4 mmol/L — ABNORMAL LOW (ref 3.5–5.1)
Sodium: 136 mmol/L (ref 135–145)

## 2020-09-15 LAB — CBC
HCT: 34.3 % — ABNORMAL LOW (ref 39.0–52.0)
Hemoglobin: 10.9 g/dL — ABNORMAL LOW (ref 13.0–17.0)
MCH: 27.7 pg (ref 26.0–34.0)
MCHC: 31.8 g/dL (ref 30.0–36.0)
MCV: 87.1 fL (ref 80.0–100.0)
Platelets: 372 10*3/uL (ref 150–400)
RBC: 3.94 MIL/uL — ABNORMAL LOW (ref 4.22–5.81)
RDW: 16.5 % — ABNORMAL HIGH (ref 11.5–15.5)
WBC: 11.2 10*3/uL — ABNORMAL HIGH (ref 4.0–10.5)
nRBC: 0 % (ref 0.0–0.2)

## 2020-09-15 LAB — MAGNESIUM: Magnesium: 2 mg/dL (ref 1.7–2.4)

## 2020-09-15 LAB — PHOSPHORUS: Phosphorus: 4.9 mg/dL — ABNORMAL HIGH (ref 2.5–4.6)

## 2020-09-15 MED ORDER — LIDOCAINE VISCOUS HCL 2 % MT SOLN
OROMUCOSAL | Status: AC
  Filled 2020-09-15: qty 15

## 2020-09-15 NOTE — Progress Notes (Signed)
Pulmonary Critical Care Medicine [facility] [place]   PULMONARY CRITICAL CARE SERVICE  PROGRESS NOTE     Alex Rojas  [id number]  DOB: 07-Dec-1970   DOA: 08/27/2020  Referring Physician: Carron Curie, MD  HPI: Alex Rojas is a 50 y.o. male seen for follow up of Acute on Chronic Respiratory Failure.  Patient is capping currently is on 1 L oxygen goal was 24 hours  Medications: Reviewed on Rounds  Physical Exam:  Vitals: Temperature is 97.2 pulse 86 respiratory rate is 19 blood pressure is 124/74 saturations 97%  Ventilator Settings patient is on capping trials 1 L of O2  General: Comfortable at this time ENT: grossly unremarkable Neck: no obvious mass Cardiovascular: no malignant arrhythmias Respiratory: No rhonchi very coarse breath sounds Abdomen: soft Skin: no rash seen on limited exam Musculoskeletal: not rigid Psychiatric:unable to assess Neurologic: no seizure no involuntary movements         Lab Data:   Basic Metabolic Panel: Recent Labs  Lab 09/09/20 0435 09/10/20 0424 09/11/20 0422 09/12/20 0447  NA 128* 131* 132* 133*  K 3.4* 3.5 3.6 3.7  CL 84* 87* 88* 90*  CO2 31 31 33* 31  GLUCOSE 167* 156* 153* 145*  BUN 24* 21* 20 19  CREATININE 0.70 0.74 0.66 0.67  CALCIUM 10.1 10.4* 10.6* 11.0*  MG 1.8 1.9 2.0 1.9  PHOS 5.8* 5.6* 6.0* 5.8*    ABG: Recent Labs  Lab 09/08/20 1010  PHART 7.452*  PCO2ART 47.4  PO2ART 81.7*  HCO3 32.6*  O2SAT 96.6    Liver Function Tests: No results for input(s): AST, ALT, ALKPHOS, BILITOT, PROT, ALBUMIN in the last 168 hours. No results for input(s): LIPASE, AMYLASE in the last 168 hours. No results for input(s): AMMONIA in the last 168 hours.  CBC: Recent Labs  Lab 09/10/20 0424 09/12/20 0447  WBC 12.4* 11.0*  HGB 11.0* 10.6*  HCT 34.3* 33.2*  MCV 86.2 86.2  PLT 347 363    Cardiac Enzymes: No results for input(s): CKTOTAL, CKMB, CKMBINDEX, TROPONINI in the last 168  hours.  BNP (last 3 results) No results for input(s): BNP in the last 8760 hours.  ProBNP (last 3 results) No results for input(s): PROBNP in the last 8760 hours.  Radiological Exams: CT ABDOMEN PELVIS WO CONTRAST  Result Date: 09/14/2020 CLINICAL DATA:  Peg site pain. EXAM: CT ABDOMEN AND PELVIS WITHOUT CONTRAST TECHNIQUE: Multidetector CT imaging of the abdomen and pelvis was performed following the standard protocol without IV contrast. COMPARISON:  08/07/2020 and 07/24/2020 FINDINGS: Lower chest: Interval resolution of previously seen bilateral pleural effusions. Residual bibasilar atelectasis. Hepatobiliary: Previous cholecystectomy. Liver and biliary tree are otherwise unremarkable. Pancreas: Normal. Spleen: Normal. Adrenals/Urinary Tract: Adrenal glands are normal. Kidneys are normal in size without hydronephrosis or nephrolithiasis. Ureters are normal. Bladder is decompressed with presence of Foley catheter. Stomach/Bowel: Evidence of patient's percutaneous gastrostomy tube. Tube position has changed slightly compared to the previous exams. The hub of the gastrostomy tube no longer abuts the gastric wall as this has been pulled back slightly and is located within the left rectus abdominus muscle approximately 9 mm superficial to the gastric wall. Stomach is otherwise unremarkable. Small bowel is normal. Appendix is normal. Colon is within normal. Vascular/Lymphatic: Mild calcified plaque over the abdominal aorta which is normal in caliber. No significant adenopathy. Stable small lymph nodes over the upper abdomen/periaortic region. Reproductive: Normal. Other: No significant free fluid or focal inflammatory change. Musculoskeletal: Mild degenerative change of the  spine and hips. IMPRESSION: 1. Evidence of patient's percutaneous gastrostomy tube. Tube position has changed slightly compared to the previous exams. The hub of the gastrostomy tube no longer abuts the gastric wall as this has been  pulled back slightly and is located within the left rectus abdominus muscle approximately 9 mm superficial to the gastric wall. 2. Interval resolution of previously seen bilateral pleural effusions with mild residual bibasilar atelectasis. 3. Aortic atherosclerosis. Aortic Atherosclerosis (ICD10-I70.0). Electronically Signed   By: Elberta Fortis M.D.   On: 09/14/2020 10:16   DG ABDOMEN PEG TUBE LOCATION  Result Date: 09/13/2020 CLINICAL DATA:  Status post PEG tube placement. EXAM: ABDOMEN - 1 VIEW COMPARISON:  08/27/2020 FINDINGS: 20 cc of contrast were administered via the PEG tube. This demonstrates no contrast extravasation. Right hemidiaphragm elevation with adjacent volume loss. No gaseous distention of bowel loops. IMPRESSION: PEG tube within the stomach, without contrast extravasation. Electronically Signed   By: Jeronimo Greaves M.D.   On: 09/13/2020 11:10    Assessment/Plan Active Problems:   Acute on chronic respiratory failure with hypoxia (HCC)   Severe sepsis (HCC)   History of alcohol abuse   Pneumonia of both lower lobes due to Pseudomonas species (HCC)   Acute on chronic respiratory failure with hypoxia plan is to continue with capping today will be 24 hours Severe sepsis treated in resolution we will continue to monitor History of alcohol abuse no sign of active withdrawal Pneumonia due to Pseudomonas has been treated improving slowly we will continue to monitor closely.   I have personally seen and evaluated the patient, evaluated laboratory and imaging results, formulated the assessment and plan and placed orders. The Patient requires high complexity decision making with multiple systems involvement.  Rounds were done with the Respiratory Therapy Director and Staff therapists and discussed with nursing staff also.  Yevonne Pax, MD [facility] Pulmonary Critical Care Medicine Sleep Medicine

## 2020-09-16 ENCOUNTER — Other Ambulatory Visit (HOSPITAL_COMMUNITY): Payer: No Typology Code available for payment source

## 2020-09-16 DIAGNOSIS — F1011 Alcohol abuse, in remission: Secondary | ICD-10-CM | POA: Diagnosis not present

## 2020-09-16 DIAGNOSIS — J9621 Acute and chronic respiratory failure with hypoxia: Secondary | ICD-10-CM | POA: Diagnosis not present

## 2020-09-16 DIAGNOSIS — A419 Sepsis, unspecified organism: Secondary | ICD-10-CM | POA: Diagnosis not present

## 2020-09-16 DIAGNOSIS — J151 Pneumonia due to Pseudomonas: Secondary | ICD-10-CM | POA: Diagnosis not present

## 2020-09-16 NOTE — Progress Notes (Signed)
Pulmonary Critical Care Medicine [facility] [place]   PULMONARY CRITICAL CARE SERVICE  PROGRESS NOTE     Alex Rojas  URK:[id number]  DOB: 11/16/1970   DOA: 08/27/2020  Referring Physician: Carron Curie, MD  HPI: Alex Rojas is a 50 y.o. male seen for follow up of Acute on Chronic Respiratory Failure.  Patient is comfortable right now without distress has been doing well with capping trials on 2 L will be completing 48 hours today  Medications: Reviewed on Rounds  Physical Exam:  Vitals: Temperature is 98.4 pulse 88 respiratory rate is 20 blood pressure is 121/73 saturations 97%  Ventilator Settings capping right now on 2 L of O2  General: Comfortable at this time Neck: supple Cardiovascular: no malignant arrhythmias Respiratory: Scattered coarse breath sounds Skin: no rash seen on limited exam Musculoskeletal: No gross abnormality Psychiatric:unable to assess Neurologic:no involuntary movements         Lab Data:   Basic Metabolic Panel: Recent Labs  Lab 09/10/20 0424 09/11/20 0422 09/12/20 0447 09/15/20 0810  NA 131* 132* 133* 136  K 3.5 3.6 3.7 3.4*  CL 87* 88* 90* 98  CO2 31 33* 31 27  GLUCOSE 156* 153* 145* 102*  BUN 21* 20 19 13   CREATININE 0.74 0.66 0.67 0.62  CALCIUM 10.4* 10.6* 11.0* 10.3  MG 1.9 2.0 1.9 2.0  PHOS 5.6* 6.0* 5.8* 4.9*    ABG: No results for input(s): PHART, PCO2ART, PO2ART, HCO3, O2SAT in the last 168 hours.  Liver Function Tests: No results for input(s): AST, ALT, ALKPHOS, BILITOT, PROT, ALBUMIN in the last 168 hours. No results for input(s): LIPASE, AMYLASE in the last 168 hours. No results for input(s): AMMONIA in the last 168 hours.  CBC: Recent Labs  Lab 09/10/20 0424 09/12/20 0447 09/15/20 0810  WBC 12.4* 11.0* 11.2*  HGB 11.0* 10.6* 10.9*  HCT 34.3* 33.2* 34.3*  MCV 86.2 86.2 87.1  PLT 347 363 372    Cardiac Enzymes: No results for input(s): CKTOTAL, CKMB, CKMBINDEX, TROPONINI  in the last 168 hours.  BNP (last 3 results) No results for input(s): BNP in the last 8760 hours.  ProBNP (last 3 results) No results for input(s): PROBNP in the last 8760 hours.  Radiological Exams: CT ABDOMEN PELVIS WO CONTRAST  Result Date: 09/14/2020 CLINICAL DATA:  Peg site pain. EXAM: CT ABDOMEN AND PELVIS WITHOUT CONTRAST TECHNIQUE: Multidetector CT imaging of the abdomen and pelvis was performed following the standard protocol without IV contrast. COMPARISON:  08/07/2020 and 07/24/2020 FINDINGS: Lower chest: Interval resolution of previously seen bilateral pleural effusions. Residual bibasilar atelectasis. Hepatobiliary: Previous cholecystectomy. Liver and biliary tree are otherwise unremarkable. Pancreas: Normal. Spleen: Normal. Adrenals/Urinary Tract: Adrenal glands are normal. Kidneys are normal in size without hydronephrosis or nephrolithiasis. Ureters are normal. Bladder is decompressed with presence of Foley catheter. Stomach/Bowel: Evidence of patient's percutaneous gastrostomy tube. Tube position has changed slightly compared to the previous exams. The hub of the gastrostomy tube no longer abuts the gastric wall as this has been pulled back slightly and is located within the left rectus abdominus muscle approximately 9 mm superficial to the gastric wall. Stomach is otherwise unremarkable. Small bowel is normal. Appendix is normal. Colon is within normal. Vascular/Lymphatic: Mild calcified plaque over the abdominal aorta which is normal in caliber. No significant adenopathy. Stable small lymph nodes over the upper abdomen/periaortic region. Reproductive: Normal. Other: No significant free fluid or focal inflammatory change. Musculoskeletal: Mild degenerative change of the spine and hips. IMPRESSION:  1. Evidence of patient's percutaneous gastrostomy tube. Tube position has changed slightly compared to the previous exams. The hub of the gastrostomy tube no longer abuts the gastric wall as  this has been pulled back slightly and is located within the left rectus abdominus muscle approximately 9 mm superficial to the gastric wall. 2. Interval resolution of previously seen bilateral pleural effusions with mild residual bibasilar atelectasis. 3. Aortic atherosclerosis. Aortic Atherosclerosis (ICD10-I70.0). Electronically Signed   By: Elberta Fortis M.D.   On: 09/14/2020 10:16    Assessment/Plan Active Problems:   Acute on chronic respiratory failure with hypoxia (HCC)   Severe sepsis (HCC)   History of alcohol abuse   Pneumonia of both lower lobes due to Pseudomonas species (HCC)   Acute on chronic respiratory failure hypoxia plan is going to be to continue with the capping trials we will continue to monitor closely. Severe sepsis treated in resolution History of alcohol abuse patient is at baseline we will continue to monitor Pneumonia due to Pseudomonas treated clinically is improving   I have personally seen and evaluated the patient, evaluated laboratory and imaging results, formulated the assessment and plan and placed orders. The Patient requires high complexity decision making with multiple systems involvement.  Rounds were done with the Respiratory Therapy Director and Staff therapists and discussed with nursing staff also.  Yevonne Pax, MD [facility] Pulmonary Critical Care Medicine Sleep Medicine

## 2020-09-17 ENCOUNTER — Other Ambulatory Visit (HOSPITAL_COMMUNITY): Payer: No Typology Code available for payment source

## 2020-09-17 DIAGNOSIS — J9621 Acute and chronic respiratory failure with hypoxia: Secondary | ICD-10-CM | POA: Diagnosis not present

## 2020-09-17 DIAGNOSIS — A419 Sepsis, unspecified organism: Secondary | ICD-10-CM | POA: Diagnosis not present

## 2020-09-17 DIAGNOSIS — J151 Pneumonia due to Pseudomonas: Secondary | ICD-10-CM | POA: Diagnosis not present

## 2020-09-17 DIAGNOSIS — F1011 Alcohol abuse, in remission: Secondary | ICD-10-CM | POA: Diagnosis not present

## 2020-09-17 HISTORY — PX: IR GASTROSTOMY TUBE REMOVAL: IMG5492

## 2020-09-17 LAB — CBC
HCT: 32.8 % — ABNORMAL LOW (ref 39.0–52.0)
Hemoglobin: 10.5 g/dL — ABNORMAL LOW (ref 13.0–17.0)
MCH: 27.3 pg (ref 26.0–34.0)
MCHC: 32 g/dL (ref 30.0–36.0)
MCV: 85.4 fL (ref 80.0–100.0)
Platelets: 370 10*3/uL (ref 150–400)
RBC: 3.84 MIL/uL — ABNORMAL LOW (ref 4.22–5.81)
RDW: 16.5 % — ABNORMAL HIGH (ref 11.5–15.5)
WBC: 12.3 10*3/uL — ABNORMAL HIGH (ref 4.0–10.5)
nRBC: 0 % (ref 0.0–0.2)

## 2020-09-17 LAB — COMPREHENSIVE METABOLIC PANEL
ALT: 25 U/L (ref 0–44)
AST: 19 U/L (ref 15–41)
Albumin: 3.5 g/dL (ref 3.5–5.0)
Alkaline Phosphatase: 88 U/L (ref 38–126)
Anion gap: 11 (ref 5–15)
BUN: 15 mg/dL (ref 6–20)
CO2: 25 mmol/L (ref 22–32)
Calcium: 10.1 mg/dL (ref 8.9–10.3)
Chloride: 97 mmol/L — ABNORMAL LOW (ref 98–111)
Creatinine, Ser: 0.63 mg/dL (ref 0.61–1.24)
GFR, Estimated: >60 mL/min (ref >60)
Glucose, Bld: 111 mg/dL — ABNORMAL HIGH (ref 70–99)
Potassium: 3.8 mmol/L (ref 3.5–5.1)
Sodium: 133 mmol/L — ABNORMAL LOW (ref 135–145)
Total Bilirubin: 0.5 mg/dL (ref 0.3–1.2)
Total Protein: 8 g/dL (ref 6.5–8.1)

## 2020-09-17 LAB — MAGNESIUM: Magnesium: 1.8 mg/dL (ref 1.7–2.4)

## 2020-09-17 NOTE — Procedures (Signed)
PROCEDURE SUMMARY:  Successful removal of gastrostomy tube.  No complications.   EBL = trace  Please see full dictation in imaging section of Epic for procedure details.   Lynann Bologna Ariv Penrod PA-C 09/17/2020 12:39 PM

## 2020-09-17 NOTE — Progress Notes (Signed)
Pulmonary Critical Care Medicine [facility] [place]   PULMONARY CRITICAL CARE SERVICE  PROGRESS NOTE     Alex Rojas  QMG:[id number]  DOB: [birth year]   DOA: 08/27/2020  Referring Physician: Carron Curie, MD  HPI: Alex Rojas is a 50 y.o. male being followed for ventilator/airway/oxygen weaning Acute on Chronic Respiratory Failure.  Patient is doing well clinically with capping 72 hours ready for decannulation.  Patient has been refusing BiPAP at nighttime and states that he will absolutely not use it  Medications: Reviewed on Rounds  Physical Exam:  Vitals: Temperature is 97.4 pulse 96 respiratory rate is 18 blood pressure is 123/82 saturations 100%  Ventilator Settings capping currently on room air  General: Comfortable at this time Neck: supple Cardiovascular: no malignant arrhythmias Respiratory: No rhonchi very coarse breath sounds Skin: no rash seen on limited exam Musculoskeletal: No gross abnormality Psychiatric:unable to assess Neurologic:no involuntary movements         Lab Data:   Basic Metabolic Panel: Recent Labs  Lab 09/11/20 0422 09/12/20 0447 09/15/20 0810 09/17/20 0008  NA 132* 133* 136 133*  K 3.6 3.7 3.4* 3.8  CL 88* 90* 98 97*  CO2 33* 31 27 25   GLUCOSE 153* 145* 102* 111*  BUN 20 19 13 15   CREATININE 0.66 0.67 0.62 0.63  CALCIUM 10.6* 11.0* 10.3 10.1  MG 2.0 1.9 2.0 1.8  PHOS 6.0* 5.8* 4.9*  --     ABG: No results for input(s): PHART, PCO2ART, PO2ART, HCO3, O2SAT in the last 168 hours.  Liver Function Tests: Recent Labs  Lab 09/17/20 0008  AST 19  ALT 25  ALKPHOS 88  BILITOT 0.5  PROT 8.0  ALBUMIN 3.5   No results for input(s): LIPASE, AMYLASE in the last 168 hours. No results for input(s): AMMONIA in the last 168 hours.  CBC: Recent Labs  Lab 09/12/20 0447 09/15/20 0810 09/17/20 0008  WBC 11.0* 11.2* 12.3*  HGB 10.6* 10.9* 10.5*  HCT 33.2* 34.3* 32.8*  MCV 86.2 87.1 85.4  PLT 363  372 370    Cardiac Enzymes: No results for input(s): CKTOTAL, CKMB, CKMBINDEX, TROPONINI in the last 168 hours.  BNP (last 3 results) No results for input(s): BNP in the last 8760 hours.  ProBNP (last 3 results) No results for input(s): PROBNP in the last 8760 hours.  Radiological Exams: No results found.  Assessment/Plan Active Problems:   Acute on chronic respiratory failure with hypoxia (HCC)   Severe sepsis (HCC)   History of alcohol abuse   Pneumonia of both lower lobes due to Pseudomonas species (HCC)   Acute on chronic respiratory failure hypoxia we will continue with the weaning process and proceed to decannulate today.  Patient was warned about the dangers of not using BiPAP however he refuses to use BiPAP Severe sepsis treated resolved History of alcohol abuse no change no withdrawal Pneumonia due to Pseudomonas has been treated clinically is improving we will continue with supportive care   I have personally seen and evaluated the patient, evaluated laboratory and imaging results, formulated the assessment and plan and placed orders. The Patient requires high complexity decision making with multiple systems involvement.  Rounds were done with the Respiratory Therapy Director and Staff therapists and discussed with nursing staff also.  09/17/20, MD [facility] Pulmonary Critical Care Medicine Sleep Medicine

## 2020-09-18 DIAGNOSIS — A419 Sepsis, unspecified organism: Secondary | ICD-10-CM | POA: Diagnosis not present

## 2020-09-18 DIAGNOSIS — J9621 Acute and chronic respiratory failure with hypoxia: Secondary | ICD-10-CM | POA: Diagnosis not present

## 2020-09-18 DIAGNOSIS — J151 Pneumonia due to Pseudomonas: Secondary | ICD-10-CM | POA: Diagnosis not present

## 2020-09-18 DIAGNOSIS — F1011 Alcohol abuse, in remission: Secondary | ICD-10-CM | POA: Diagnosis not present

## 2020-09-18 NOTE — Progress Notes (Signed)
Pulmonary Critical Care Medicine [facility] [place]   PULMONARY CRITICAL CARE SERVICE  PROGRESS NOTE     Alex Rojas  TDD:[id number]  DOB: 1970-10-13   DOA: 08/27/2020  Referring Physician: Carron Curie, MD  HPI: Alex Rojas is a 50 y.o. male being followed for ventilator/airway/oxygen weaning Acute on Chronic Respiratory Failure.  Patient continues to refuse using CPAP right now is on 2 L of oxygen  Medications: Reviewed on Rounds  Physical Exam:  Vitals: Temperature is 97.7 pulse 94 respiratory rate 16 blood pressure is 122/77 saturations 100%  Ventilator Settings on 2 L nasal cannula  General: Comfortable at this time Neck: supple Cardiovascular: no malignant arrhythmias Respiratory: No rhonchi very coarse breath sounds Skin: no rash seen on limited exam Musculoskeletal: No gross abnormality Psychiatric:unable to assess Neurologic:no involuntary movements         Lab Data:   Basic Metabolic Panel: Recent Labs  Lab 09/12/20 0447 09/15/20 0810 09/17/20 0008  NA 133* 136 133*  K 3.7 3.4* 3.8  CL 90* 98 97*  CO2 31 27 25   GLUCOSE 145* 102* 111*  BUN 19 13 15   CREATININE 0.67 0.62 0.63  CALCIUM 11.0* 10.3 10.1  MG 1.9 2.0 1.8  PHOS 5.8* 4.9*  --     ABG: No results for input(s): PHART, PCO2ART, PO2ART, HCO3, O2SAT in the last 168 hours.  Liver Function Tests: Recent Labs  Lab 09/17/20 0008  AST 19  ALT 25  ALKPHOS 88  BILITOT 0.5  PROT 8.0  ALBUMIN 3.5   No results for input(s): LIPASE, AMYLASE in the last 168 hours. No results for input(s): AMMONIA in the last 168 hours.  CBC: Recent Labs  Lab 09/12/20 0447 09/15/20 0810 09/17/20 0008  WBC 11.0* 11.2* 12.3*  HGB 10.6* 10.9* 10.5*  HCT 33.2* 34.3* 32.8*  MCV 86.2 87.1 85.4  PLT 363 372 370    Cardiac Enzymes: No results for input(s): CKTOTAL, CKMB, CKMBINDEX, TROPONINI in the last 168 hours.  BNP (last 3 results) No results for input(s): BNP in the  last 8760 hours.  ProBNP (last 3 results) No results for input(s): PROBNP in the last 8760 hours.  Radiological Exams: IR GASTROSTOMY TUBE REMOVAL  Result Date: 09/17/2020 INDICATION: Gastrostomy tube located within the left rectus abdominus muscle. Request for gastrostomy tube removal. EXAM: REMOVAL OF GASTROSTOMY TUBE MEDICATIONS: None COMPLICATIONS: None immediate. PROCEDURE: Informed written consent was obtained from the patient after a thorough discussion of the procedural risks, benefits and alternatives. All questions were addressed. A timeout was performed prior to the initiation of the procedure. Using traction, the gastrostomy tube was removed intact. IMPRESSION: Successful removal of gastrostomy tube. Read by: 09/19/20, PA-C Electronically Signed   By: 09/19/2020 M.D.   On: 09/17/2020 12:42    Assessment/Plan Active Problems:   Acute on chronic respiratory failure with hypoxia (HCC)   Severe sepsis (HCC)   History of alcohol abuse   Pneumonia of both lower lobes due to Pseudomonas species (HCC)   Acute on chronic respiratory failure hypoxia plan is to continue with oxygen therapy patient was refusing the CPAP as already indicated Severe sepsis treated in resolution History of alcohol abuse no change we will continue with supportive care Pneumonia due to Pseudomonas treated and resolved   I have personally seen and evaluated the patient, evaluated laboratory and imaging results, formulated the assessment and plan and placed orders. The Patient requires high complexity decision making with multiple systems involvement.  Rounds  were done with the Respiratory Therapy Director and Staff therapists and discussed with nursing staff also.  Allyne Gee, MD [facility] Pulmonary Critical Care Medicine Sleep Medicine

## 2020-09-22 LAB — CBC WITH DIFFERENTIAL/PLATELET
Abs Immature Granulocytes: 0.03 10*3/uL (ref 0.00–0.07)
Basophils Absolute: 0 10*3/uL (ref 0.0–0.1)
Basophils Relative: 1 %
Eosinophils Absolute: 0.4 10*3/uL (ref 0.0–0.5)
Eosinophils Relative: 6 %
HCT: 37.4 % — ABNORMAL LOW (ref 39.0–52.0)
Hemoglobin: 12 g/dL — ABNORMAL LOW (ref 13.0–17.0)
Immature Granulocytes: 0 %
Lymphocytes Relative: 28 %
Lymphs Abs: 2.2 10*3/uL (ref 0.7–4.0)
MCH: 27 pg (ref 26.0–34.0)
MCHC: 32.1 g/dL (ref 30.0–36.0)
MCV: 84.2 fL (ref 80.0–100.0)
Monocytes Absolute: 0.6 10*3/uL (ref 0.1–1.0)
Monocytes Relative: 8 %
Neutro Abs: 4.5 10*3/uL (ref 1.7–7.7)
Neutrophils Relative %: 57 %
Platelets: 369 10*3/uL (ref 150–400)
RBC: 4.44 MIL/uL (ref 4.22–5.81)
RDW: 15.9 % — ABNORMAL HIGH (ref 11.5–15.5)
WBC: 7.8 10*3/uL (ref 4.0–10.5)
nRBC: 0 % (ref 0.0–0.2)

## 2020-09-22 LAB — URINE CULTURE: Culture: NO GROWTH

## 2020-09-22 LAB — PROTIME-INR
INR: 1.1 (ref 0.8–1.2)
Prothrombin Time: 14.3 seconds (ref 11.4–15.2)

## 2020-09-23 LAB — MAGNESIUM: Magnesium: 1.6 mg/dL — ABNORMAL LOW (ref 1.7–2.4)

## 2020-09-23 LAB — BASIC METABOLIC PANEL
Anion gap: 12 (ref 5–15)
BUN: 17 mg/dL (ref 6–20)
CO2: 31 mmol/L (ref 22–32)
Calcium: 10.4 mg/dL — ABNORMAL HIGH (ref 8.9–10.3)
Chloride: 89 mmol/L — ABNORMAL LOW (ref 98–111)
Creatinine, Ser: 0.82 mg/dL (ref 0.61–1.24)
GFR, Estimated: >60 mL/min (ref >60)
Glucose, Bld: 111 mg/dL — ABNORMAL HIGH (ref 70–99)
Potassium: 2.5 mmol/L — CL (ref 3.5–5.1)
Sodium: 132 mmol/L — ABNORMAL LOW (ref 135–145)

## 2020-09-23 LAB — CBC
HCT: 34.6 % — ABNORMAL LOW (ref 39.0–52.0)
Hemoglobin: 11.5 g/dL — ABNORMAL LOW (ref 13.0–17.0)
MCH: 27.8 pg (ref 26.0–34.0)
MCHC: 33.2 g/dL (ref 30.0–36.0)
MCV: 83.8 fL (ref 80.0–100.0)
Platelets: 371 10*3/uL (ref 150–400)
RBC: 4.13 MIL/uL — ABNORMAL LOW (ref 4.22–5.81)
RDW: 15.9 % — ABNORMAL HIGH (ref 11.5–15.5)
WBC: 9.5 10*3/uL (ref 4.0–10.5)
nRBC: 0 % (ref 0.0–0.2)

## 2020-09-23 LAB — PHOSPHORUS: Phosphorus: 4.8 mg/dL — ABNORMAL HIGH (ref 2.5–4.6)

## 2020-09-24 LAB — MAGNESIUM
Magnesium: 1.8 mg/dL (ref 1.7–2.4)
Magnesium: 1.8 mg/dL (ref 1.7–2.4)

## 2020-09-24 LAB — BASIC METABOLIC PANEL
Anion gap: 9 (ref 5–15)
BUN: 18 mg/dL (ref 6–20)
CO2: 28 mmol/L (ref 22–32)
Calcium: 10.3 mg/dL (ref 8.9–10.3)
Chloride: 95 mmol/L — ABNORMAL LOW (ref 98–111)
Creatinine, Ser: 0.76 mg/dL (ref 0.61–1.24)
GFR, Estimated: >60 mL/min (ref >60)
Glucose, Bld: 103 mg/dL — ABNORMAL HIGH (ref 70–99)
Potassium: 4.3 mmol/L (ref 3.5–5.1)
Sodium: 132 mmol/L — ABNORMAL LOW (ref 135–145)

## 2020-09-24 LAB — PHOSPHORUS: Phosphorus: 3.6 mg/dL (ref 2.5–4.6)

## 2020-09-25 LAB — SARS CORONAVIRUS 2 (TAT 6-24 HRS): SARS Coronavirus 2: NEGATIVE

## 2020-09-26 LAB — BASIC METABOLIC PANEL WITH GFR
Anion gap: 12 (ref 5–15)
BUN: 16 mg/dL (ref 6–20)
CO2: 29 mmol/L (ref 22–32)
Calcium: 10.4 mg/dL — ABNORMAL HIGH (ref 8.9–10.3)
Chloride: 91 mmol/L — ABNORMAL LOW (ref 98–111)
Creatinine, Ser: 0.78 mg/dL (ref 0.61–1.24)
GFR, Estimated: >60 mL/min
Glucose, Bld: 92 mg/dL (ref 70–99)
Potassium: 2.8 mmol/L — ABNORMAL LOW (ref 3.5–5.1)
Sodium: 132 mmol/L — ABNORMAL LOW (ref 135–145)

## 2020-09-26 LAB — CBC
HCT: 34.9 % — ABNORMAL LOW (ref 39.0–52.0)
Hemoglobin: 11.5 g/dL — ABNORMAL LOW (ref 13.0–17.0)
MCH: 27.7 pg (ref 26.0–34.0)
MCHC: 33 g/dL (ref 30.0–36.0)
MCV: 84.1 fL (ref 80.0–100.0)
Platelets: 362 10*3/uL (ref 150–400)
RBC: 4.15 MIL/uL — ABNORMAL LOW (ref 4.22–5.81)
RDW: 15.8 % — ABNORMAL HIGH (ref 11.5–15.5)
WBC: 9.2 10*3/uL (ref 4.0–10.5)
nRBC: 0 % (ref 0.0–0.2)

## 2020-09-26 LAB — MAGNESIUM: Magnesium: 1.7 mg/dL (ref 1.7–2.4)

## 2020-09-26 LAB — PHOSPHORUS: Phosphorus: 4 mg/dL (ref 2.5–4.6)

## 2021-06-15 IMAGING — CT CT HEAD W/O CM
4 series · 16 of 47 positions shown, 18 images · non-contrast
Comparison: 08/20/2020

CLINICAL DATA: Headache.  Classic migraine.

EXAM:
CT HEAD WITHOUT CONTRAST
TECHNIQUE: Contiguous axial images were obtained from the base of the skull
through the vertex without intravenous contrast.

[Series 3: head wo · axial · 0.45mm/px · z∈[+1380,+1506]mm · 7 of 35 slices shown, 9 images]
[im 5/35  brain]
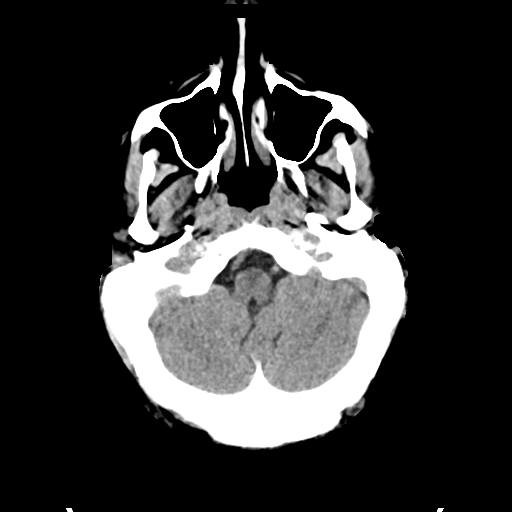
[im 5/35  bone]
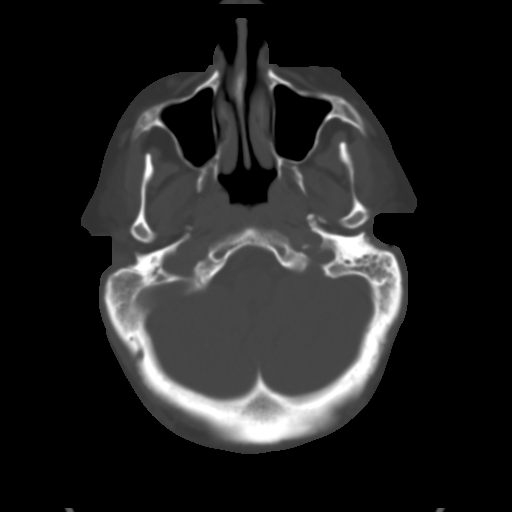
[im 9/35  brain]
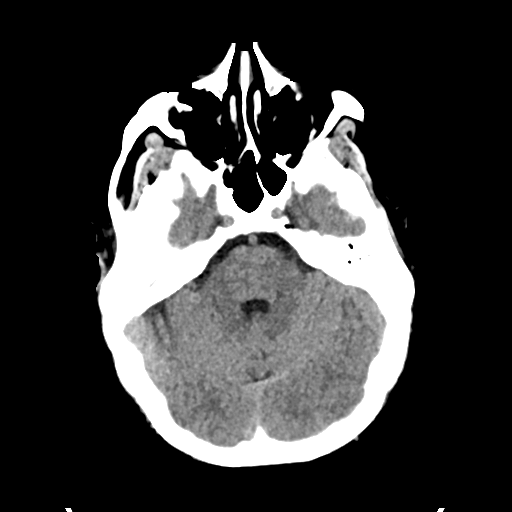
[im 13/35  brain]
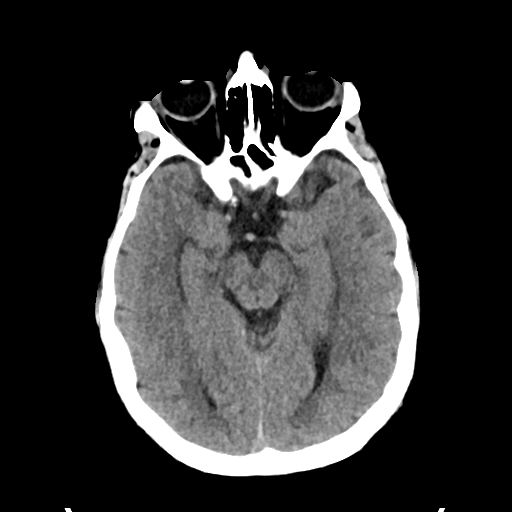
[im 18/35  brain]
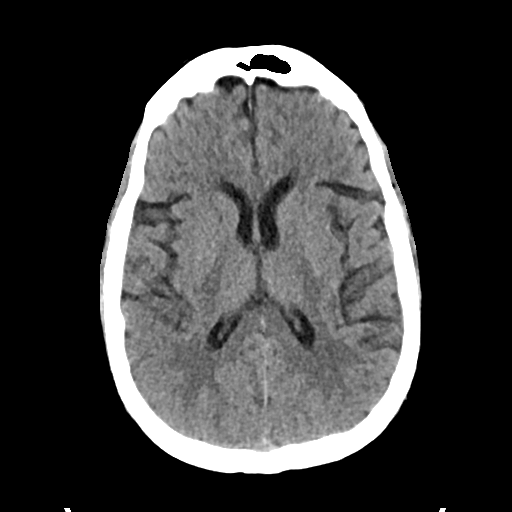
[im 22/35  brain]
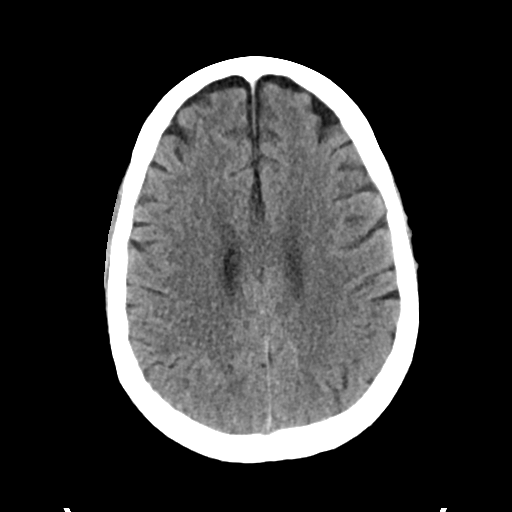
[im 22/35  bone]
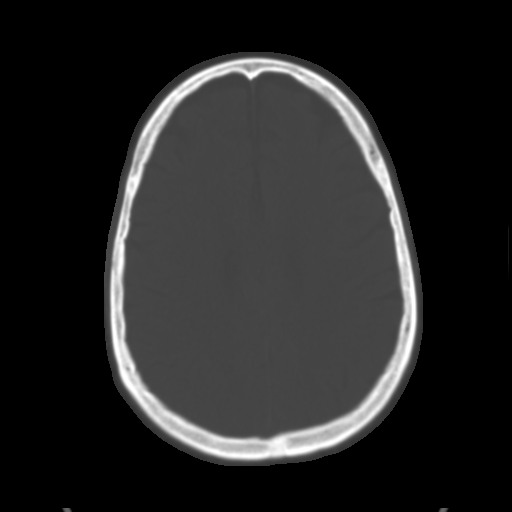
[im 26/35  brain]
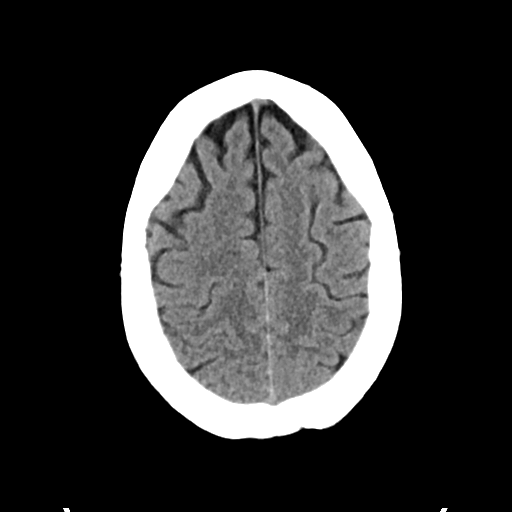
[im 30/35  brain]
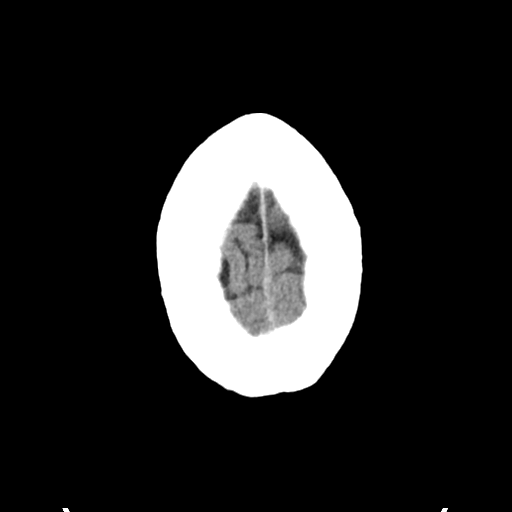

[Series 4: head bone · axial · 0.45mm/px · z∈[+1376,+1410]mm · 3 of 86 slices shown]
[im 9/86  bone]
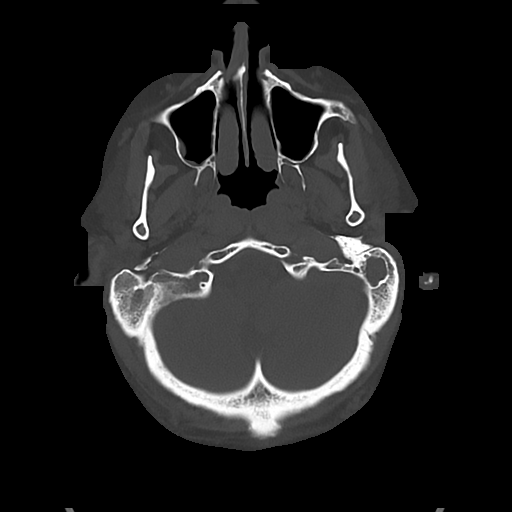
[im 18/86  bone]
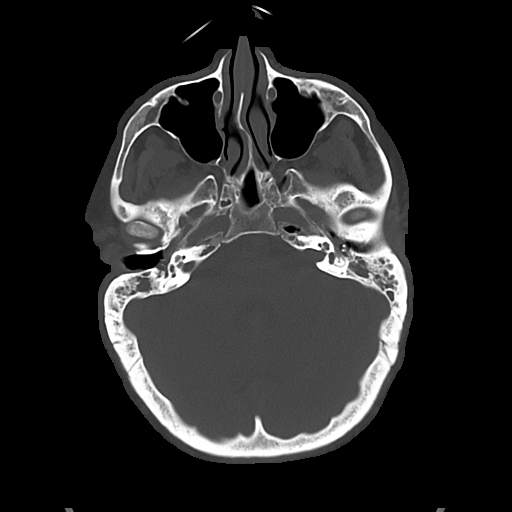
[im 26/86  bone]
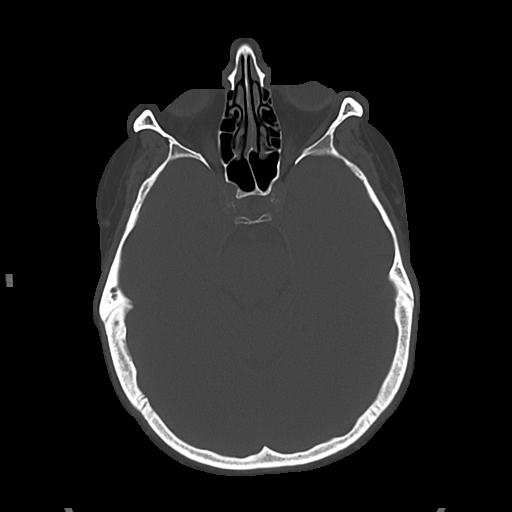

[Series 5: cor soft · coronal · 0.34mm/px · 3 of 71 slices shown]
[im 24/71  brain]
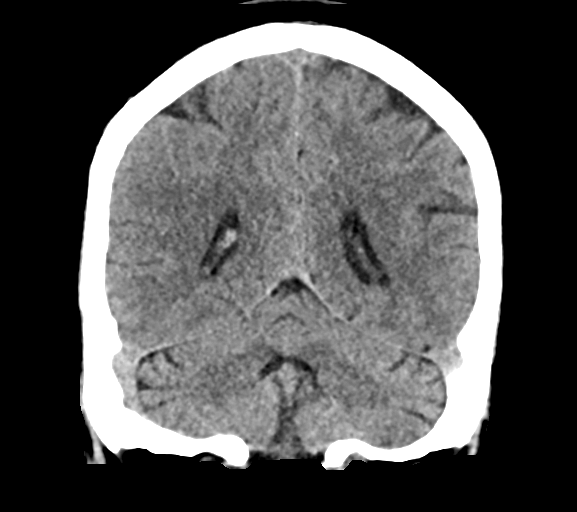
[im 32/71  brain]
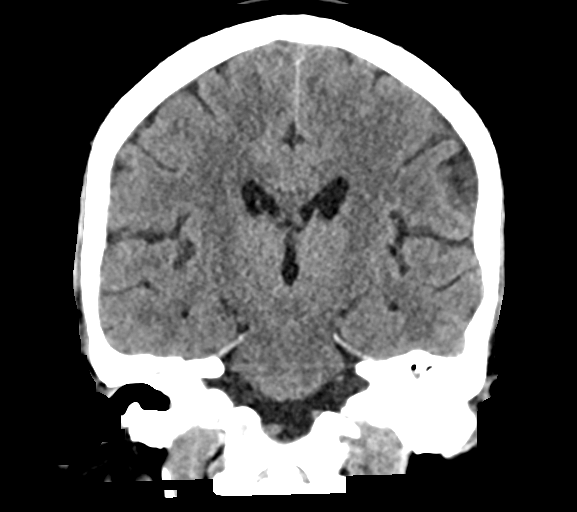
[im 39/71  brain]
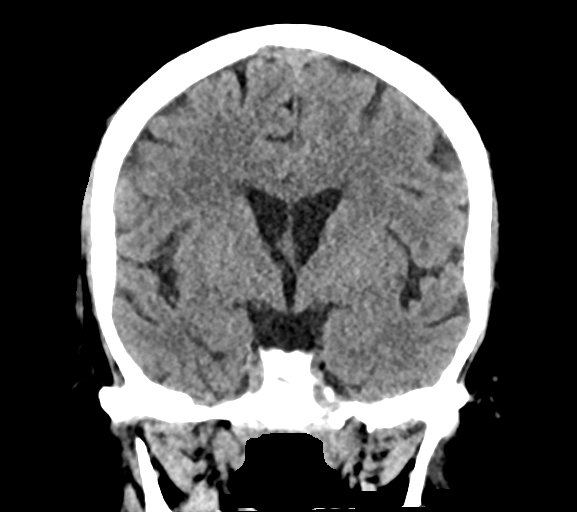

[Series 6: sag soft · sagittal · 0.34mm/px · 3 of 58 slices shown]
[im 20/58  brain]
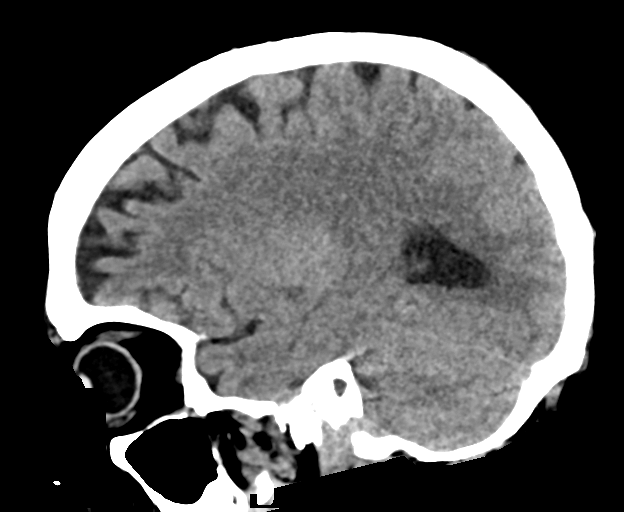
[im 29/58  brain]
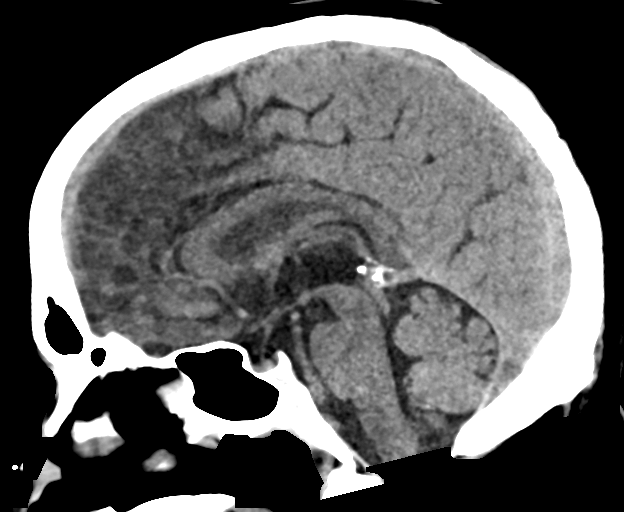
[im 39/58  brain]
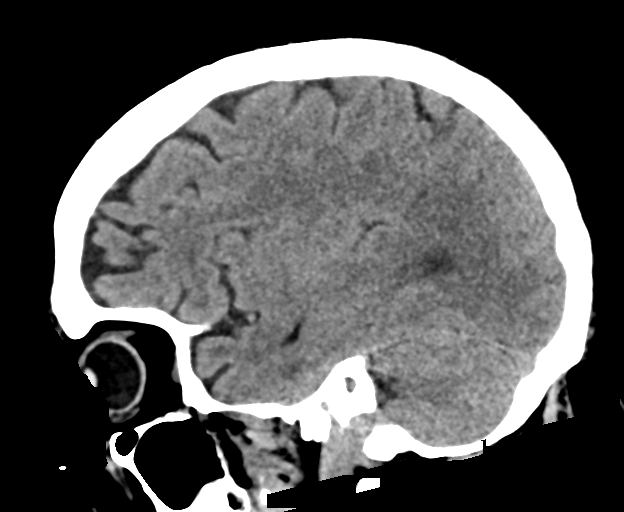

[16 of 47 positions shown; findings below may reference images not displayed]

FINDINGS: Brain: Brain has normal appearance without evidence of old or acute
infarction, mass lesion, hemorrhage, hydrocephalus or extra-axial
collection.

Vascular: There is atherosclerotic calcification of the major
vessels at the base of the brain. This is premature for age.

Skull: Normal

Sinuses/Orbits: Clear/normal

Other: Fluid in the mastoids in middle ears on both sides.
IMPRESSION: Normal appearance of the brain itself.

Atherosclerotic calcification of the major vessels at the base of
the brain, premature for age.

Fluid in the middle ears and mastoid air cells.

## 2023-05-07 DEATH — deceased
